# Patient Record
Sex: Female | Born: 2000 | Race: Black or African American | Hispanic: No | Marital: Single | Smoking: Never smoker
Health system: Southern US, Community
[De-identification: ages and names within clinical notes are randomized; demographics above are authoritative.]

## PROBLEM LIST (undated history)

## (undated) DIAGNOSIS — R87629 Unspecified abnormal cytological findings in specimens from vagina: Secondary | ICD-10-CM

## (undated) DIAGNOSIS — J45909 Unspecified asthma, uncomplicated: Secondary | ICD-10-CM

## (undated) HISTORY — DX: Unspecified asthma, uncomplicated: J45.909

## (undated) HISTORY — DX: Unspecified abnormal cytological findings in specimens from vagina: R87.629

## (undated) HISTORY — PX: COLPOSCOPY: SHX161

---
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
## 2019-08-08 ENCOUNTER — Encounter: Payer: Self-pay | Admitting: Advanced Practice Midwife

## 2019-08-23 ENCOUNTER — Telehealth: Payer: Self-pay | Admitting: Advanced Practice Midwife

## 2019-08-23 NOTE — Telephone Encounter (Signed)

## 2019-08-24 ENCOUNTER — Ambulatory Visit: Payer: BC Managed Care – PPO | Admitting: Advanced Practice Midwife

## 2019-08-24 ENCOUNTER — Encounter: Payer: Self-pay | Admitting: Advanced Practice Midwife

## 2019-08-24 ENCOUNTER — Other Ambulatory Visit: Payer: Self-pay

## 2019-08-24 VITALS — BP 112/77 | HR 80 | Ht 62.0 in

## 2019-08-24 DIAGNOSIS — N946 Dysmenorrhea, unspecified: Secondary | ICD-10-CM | POA: Insufficient documentation

## 2019-08-24 MED ORDER — MEDROXYPROGESTERONE ACETATE 150 MG/ML IM SUSP: 150.0000 mg | INTRAMUSCULAR | 0 refills | Status: DC

## 2019-08-24 NOTE — Progress Notes (Signed)
   GYN VISIT Patient name: Carla Ramsey MRN [id number]  Date of birth: [birth year] Chief Complaint:   Dysmenorrhea and Dyspareunia  History of Present Illness:   Carla Ramsey is a 19 y.o. No obstetric history on file. African American female being seen today for dysmenorrhea with regular cycles; q 28 days, lasting 5-7 days. Reports cramping and headaches, also diarrhea. She was on DMPA for 9 consecutive months prior to Covid and she enjoyed occ spotting and no cramping during that time, but since then hasn't used contraception and the dysmenorrhea resumed. Would be open to using again. Reports low abd discomfort, but not vaginal pain, during sex.  Patient's last menstrual period was 07/24/2019. The current method of family planning is condoms- would like something hormonal Last pap <21yo.  Review of Systems:   Pertinent items are noted in HPI Denies fever/chills, dizziness, headaches, visual disturbances, fatigue, shortness of breath, chest pain, abdominal pain, vomiting, abnormal vaginal discharge/itching/odor/irritation, problems with periods, bowel movements, urination, or intercourse unless otherwise stated above.  Pertinent History Reviewed:  Reviewed past medical,surgical, social, obstetrical and family history.  Reviewed problem list, medications and allergies. Physical Assessment:   Vitals:   08/24/19 1519  BP: 112/77  Pulse: 80  Height: 5\' 2"  (1.575 m)  There is no height or weight on file to calculate BMI.       Physical Examination:   General appearance: alert, well appearing, and in no distress  Mental status: alert, oriented to person, place, and time  Skin: warm & dry   Cardiovascular: normal heart rate noted  Respiratory: normal respiratory effort, no distress  Abdomen: soft, non-tender   Pelvic: examination not indicated  Extremities: no edema   Chaperone: n/a    No results found for this or any previous visit (from the past 24 hour(s)).  Assessment &  Plan:  1) Dysmenorrhea> rev'd LARCs and OCPs vs DMPA>pt elects to start using DMPA again; last IC was on 08/21/19 with a condom; will need to have 10 days without sex and with neg UPT can start DMPA  2) Abd pain during sex> rec different positions and less depth of penetration to see if it improves; discussed possibility of discomfort from uterine cramping during sex as well  Meds:  Meds ordered this encounter  Medications  . medroxyPROGESTERone (DEPO-PROVERA) 150 MG/ML injection    Sig: Inject 1 mL (150 mg total) into the muscle every 3 (three) months.    Dispense:  1 mL    Refill:  0    Order Specific Question:   Supervising Provider    Answer:   10/19/19, LUTHER H [[id number]]    No orders of the defined types were placed in this encounter.   Return in about 1 week (around 08/31/2019) for RN visit for depo; then 3 month f/u depo inj and provider visit.  09/02/2019 CNM 08/24/2019 10:26 PM

## 2019-08-24 NOTE — Patient Instructions (Signed)
Medroxyprogesterone injection [Contraceptive] What is this medicine? MEDROXYPROGESTERONE (me DROX ee proe JES te rone) contraceptive injections prevent pregnancy. They provide effective birth control for 3 months. Depo-subQ Provera 104 is also used for treating pain related to endometriosis. This medicine may be used for other purposes; ask your health care provider or pharmacist if you have questions. COMMON BRAND NAME(S): Depo-Provera, Depo-subQ Provera 104 What should I tell my health care provider before I take this medicine? They need to know if you have any of these conditions:  frequently drink alcohol  asthma  blood vessel disease or a history of a blood clot in the lungs or legs  bone disease such as osteoporosis  breast cancer  diabetes  eating disorder (anorexia nervosa or bulimia)  high blood pressure  HIV infection or AIDS  kidney disease  liver disease  mental depression  migraine  seizures (convulsions)  stroke  tobacco smoker  vaginal bleeding  an unusual or allergic reaction to medroxyprogesterone, other hormones, medicines, foods, dyes, or preservatives  pregnant or trying to get pregnant  breast-feeding How should I use this medicine? Depo-Provera Contraceptive injection is given into a muscle. Depo-subQ Provera 104 injection is given under the skin. These injections are given by a health care professional. You must not be pregnant before getting an injection. The injection is usually given during the first 5 days after the start of a menstrual period or 6 weeks after delivery of a baby. Talk to your pediatrician regarding the use of this medicine in children. Special care may be needed. These injections have been used in female children who have started having menstrual periods. Overdosage: If you think you have taken too much of this medicine contact a poison control center or emergency room at once. NOTE: This medicine is only for you. Do not  share this medicine with others. What if I miss a dose? Try not to miss a dose. You must get an injection once every 3 months to maintain birth control. If you cannot keep an appointment, call and reschedule it. If you wait longer than 13 weeks between Depo-Provera contraceptive injections or longer than 14 weeks between Depo-subQ Provera 104 injections, you could get pregnant. Use another method for birth control if you miss your appointment. You may also need a pregnancy test before receiving another injection. What may interact with this medicine? Do not take this medicine with any of the following medications:  bosentan This medicine may also interact with the following medications:  aminoglutethimide  antibiotics or medicines for infections, especially rifampin, rifabutin, rifapentine, and griseofulvin  aprepitant  barbiturate medicines such as phenobarbital or primidone  bexarotene  carbamazepine  medicines for seizures like ethotoin, felbamate, oxcarbazepine, phenytoin, topiramate  modafinil  St. John's wort This list may not describe all possible interactions. Give your health care provider a list of all the medicines, herbs, non-prescription drugs, or dietary supplements you use. Also tell them if you smoke, drink alcohol, or use illegal drugs. Some items may interact with your medicine. What should I watch for while using this medicine? This drug does not protect you against HIV infection (AIDS) or other sexually transmitted diseases. Use of this product may cause you to lose calcium from your bones. Loss of calcium may cause weak bones (osteoporosis). Only use this product for more than 2 years if other forms of birth control are not right for you. The longer you use this product for birth control the more likely you will be at risk   for weak bones. Ask your health care professional how you can keep strong bones. You may have a change in bleeding pattern or irregular periods.  Many females stop having periods while taking this drug. If you have received your injections on time, your chance of being pregnant is very low. If you think you may be pregnant, see your health care professional as soon as possible. Tell your health care professional if you want to get pregnant within the next year. The effect of this medicine may last a long time after you get your last injection. What side effects may I notice from receiving this medicine? Side effects that you should report to your doctor or health care professional as soon as possible:  allergic reactions like skin rash, itching or hives, swelling of the face, lips, or tongue  breast tenderness or discharge  breathing problems  changes in vision  depression  feeling faint or lightheaded, falls  fever  pain in the abdomen, chest, groin, or leg  problems with balance, talking, walking  unusually weak or tired  yellowing of the eyes or skin Side effects that usually do not require medical attention (report to your doctor or health care professional if they continue or are bothersome):  acne  fluid retention and swelling  headache  irregular periods, spotting, or absent periods  temporary pain, itching, or skin reaction at site where injected  weight gain This list may not describe all possible side effects. Call your doctor for medical advice about side effects. You may report side effects to FDA at 1-800-FDA-1088. Where should I keep my medicine? This does not apply. The injection will be given to you by a health care professional. NOTE: This sheet is a summary. It may not cover all possible information. If you have questions about this medicine, talk to your doctor, pharmacist, or health care provider.  2020 Elsevier/Gold Standard (2008-07-21 18:37:56)  

## 2019-08-30 ENCOUNTER — Telehealth: Payer: Self-pay | Admitting: Obstetrics & Gynecology

## 2019-08-30 NOTE — Telephone Encounter (Signed)

## 2019-08-31 ENCOUNTER — Other Ambulatory Visit: Payer: Self-pay

## 2019-08-31 ENCOUNTER — Ambulatory Visit (INDEPENDENT_AMBULATORY_CARE_PROVIDER_SITE_OTHER): Payer: BC Managed Care – PPO

## 2019-08-31 VITALS — Ht 61.0 in | Wt 134.0 lb

## 2019-08-31 DIAGNOSIS — Z3042 Encounter for surveillance of injectable contraceptive: Secondary | ICD-10-CM | POA: Diagnosis not present

## 2019-08-31 DIAGNOSIS — Z3202 Encounter for pregnancy test, result negative: Secondary | ICD-10-CM | POA: Diagnosis not present

## 2019-08-31 LAB — POCT URINE PREGNANCY: Preg Test, Ur: NEGATIVE

## 2019-08-31 MED ORDER — MEDROXYPROGESTERONE ACETATE 150 MG/ML IM SUSP
150.0000 mg | Freq: Once | INTRAMUSCULAR | Status: AC
  Administered 2019-08-31: 10:00:00 150 mg via INTRAMUSCULAR

## 2019-08-31 NOTE — Progress Notes (Signed)
   NURSE VISIT- INJECTION  SUBJECTIVE:  Carla Ramsey is a 19 y.o. [place] female here for a Depo Provera for contraception/period management. She is a GYN patient.   OBJECTIVE:  There were no vitals taken for this visit.  Appears well, in no apparent distress  Injection administered in: Right deltoid    ASSESSMENT: GYN patient Depo Provera for contraception/period management PLAN: Follow-up: in 11-13 weeks for next Depo   Rennis Petty  08/31/2019 10:12 AM

## 2019-08-31 NOTE — Addendum Note (Signed)
Addended by: Federico Flake A on: 08/31/2019 10:31 AM   Modules accepted: Orders

## 2019-11-22 ENCOUNTER — Telehealth: Payer: Self-pay | Admitting: Women's Health

## 2019-11-22 NOTE — Telephone Encounter (Signed)
Called left vm with patient reminding her of appointment and to wear a face mask also if she has covid like symptoms to call office to r/s appointment.

## 2019-11-23 ENCOUNTER — Other Ambulatory Visit: Payer: Self-pay | Admitting: Advanced Practice Midwife

## 2019-11-23 ENCOUNTER — Telehealth: Payer: Self-pay | Admitting: *Deleted

## 2019-11-23 ENCOUNTER — Ambulatory Visit (INDEPENDENT_AMBULATORY_CARE_PROVIDER_SITE_OTHER): Payer: BC Managed Care – PPO | Admitting: *Deleted

## 2019-11-23 ENCOUNTER — Telehealth: Payer: Self-pay | Admitting: Advanced Practice Midwife

## 2019-11-23 ENCOUNTER — Encounter: Payer: Self-pay | Admitting: *Deleted

## 2019-11-23 ENCOUNTER — Other Ambulatory Visit: Payer: Self-pay

## 2019-11-23 ENCOUNTER — Ambulatory Visit: Payer: BC Managed Care – PPO

## 2019-11-23 DIAGNOSIS — Z3042 Encounter for surveillance of injectable contraceptive: Secondary | ICD-10-CM | POA: Diagnosis not present

## 2019-11-23 MED ORDER — MEDROXYPROGESTERONE ACETATE 150 MG/ML IM SUSP: 150.0000 mg | INTRAMUSCULAR | 3 refills | Status: DC

## 2019-11-23 MED ORDER — MEDROXYPROGESTERONE ACETATE 150 MG/ML IM SUSP
150.0000 mg | Freq: Once | INTRAMUSCULAR | Status: AC
  Administered 2019-11-23: 150 mg via INTRAMUSCULAR

## 2019-11-23 NOTE — Progress Notes (Signed)
   NURSE VISIT- INJECTION  SUBJECTIVE:  Carla Ramsey is a 19 y.o. No obstetric history on file. female here for a Depo Provera for contraception/period management. She is a GYN patient.   OBJECTIVE:  There were no vitals taken for this visit.  Appears well, in no apparent distress  Injection administered in: Left deltoid  No orders of the defined types were placed in this encounter.   ASSESSMENT: GYN patient Depo Provera for contraception/period management PLAN: Follow-up: in 11-13 weeks for next Depo   Stoney Bang  11/23/2019 10:00 AM

## 2019-11-23 NOTE — Telephone Encounter (Signed)
Patient has an appointment this morning to get the Depo and she is needing a refill of her medication sent to the [facility] on [address]

## 2019-11-23 NOTE — Telephone Encounter (Signed)
Depo called into [facility] on [address]. Pt needs to schedule a physical. JSY

## 2019-11-25 ENCOUNTER — Ambulatory Visit: Payer: BC Managed Care – PPO

## 2020-02-15 ENCOUNTER — Ambulatory Visit (INDEPENDENT_AMBULATORY_CARE_PROVIDER_SITE_OTHER): Payer: BC Managed Care – PPO | Admitting: *Deleted

## 2020-02-15 DIAGNOSIS — Z308 Encounter for other contraceptive management: Secondary | ICD-10-CM | POA: Diagnosis not present

## 2020-02-15 MED ORDER — MEDROXYPROGESTERONE ACETATE 150 MG/ML IM SUSP
150.0000 mg | Freq: Once | INTRAMUSCULAR | Status: AC
  Administered 2020-02-15: 150 mg via INTRAMUSCULAR

## 2020-02-15 NOTE — Progress Notes (Signed)
   NURSE VISIT- INJECTION  SUBJECTIVE:  Carla Ramsey is a 19 y.o. No obstetric history on file. female here for a Depo Provera for contraception/period management. She is a GYN patient.   OBJECTIVE:  There were no vitals taken for this visit.  Appears well, in no apparent distress  Injection administered in: Right deltoid  Meds ordered this encounter  Medications  . medroxyPROGESTERone (DEPO-PROVERA) injection 150 mg    ASSESSMENT: GYN patient Depo Provera for contraception/period management PLAN: Follow-up: in 11-13 weeks for next Depo   Malachy Mood  02/15/2020 9:42 AM

## 2020-03-12 ENCOUNTER — Encounter: Payer: Self-pay | Admitting: Adult Health

## 2020-03-12 ENCOUNTER — Ambulatory Visit: Payer: BC Managed Care – PPO | Admitting: Adult Health

## 2020-03-12 VITALS — BP 116/77 | HR 82 | Ht 63.0 in | Wt 138.5 lb

## 2020-03-12 DIAGNOSIS — N946 Dysmenorrhea, unspecified: Secondary | ICD-10-CM

## 2020-03-12 DIAGNOSIS — N941 Unspecified dyspareunia: Secondary | ICD-10-CM | POA: Insufficient documentation

## 2020-03-12 DIAGNOSIS — R5383 Other fatigue: Secondary | ICD-10-CM | POA: Diagnosis not present

## 2020-03-12 NOTE — Progress Notes (Signed)
Patient ID: Carla Ramsey, female   DOB: 06/01/2001, 19 y.o.   MRN: [id number] History of Present Illness: Carla Ramsey is a 19 year old black female, single, G0P0, in complaining of painful periods for 4 years, bloating, feels cold at times and tired, and in last year has pain with sex.Started end of 2019 and does not have period now. She has done some research and thinks she has fibroids.  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  Current Medications, Allergies, Past Medical History, Past Surgical History, Family History and Social History were reviewed in [employer] record.     Review of Systems:  Painful periods for about 4 years Is currently taking depo, has stopped period so has helped Feels bloated Pain with sex in last year Feels cold at times and tired   Physical Exam:BP 116/77 (BP Location: Left Arm, Patient Position: Sitting, Cuff Size: Normal)    Pulse 82    Ht 5\' 3"  (1.6 m)    Wt 138 lb 8 oz (62.8 kg)    BMI 24.53 kg/m  General:  Well developed, well nourished, no acute distress Skin:  Warm and dry Neck:  Midline trachea, normal thyroid, good ROM, no lymphadenopathy Lungs; Clear to auscultation bilaterally Cardiovascular: Regular rate and rhythm Pelvic:  External genitalia is normal in appearance, no lesions.  The vagina is normal in appearance. Urethra has no lesions or masses. The cervix is nulliparous, and smooth.  Uterus is felt to be normal size, shape, and contour.  No adnexal masses or tenderness noted.Bladder is non tender, no masses felt. Extremities/musculoskeletal:  No swelling or varicosities noted, no clubbing or cyanosis Psych:  No mood changes, alert and cooperative,seems happy Fall risk is low  Upstream - 03/12/20 0840      Pregnancy Intention Screening   Does the patient want to become pregnant in the next year? No    Does the patient's partner want to become pregnant in the next year? No    Would the patient like to discuss  contraceptive options today? No      Contraception Wrap Up   Current Method Hormonal Injection    End Method Hormonal Injection    Contraception Counseling Provided No         Examination chaperoned by 03/14/20 LPN  Impression and plan:  1. Dysmenorrhea Will get GYN Malachy Mood to assess uterus, did not feel a fibroid, but did discuss may have endometriosis, due to symptoms, but [place] would not show that   Get GYN [place] in about a week and then see me about a week later for pap and physical and ROS   2. Dyspareunia in female   3. Tired Check CBC, CMP and TSH

## 2020-03-13 ENCOUNTER — Telehealth: Payer: Self-pay | Admitting: *Deleted

## 2020-03-13 LAB — COMPREHENSIVE METABOLIC PANEL
ALT: 17 IU/L (ref 0–32)
AST: 15 IU/L (ref 0–40)
Albumin/Globulin Ratio: 1.8 (ref 1.2–2.2)
Albumin: 4.6 g/dL (ref 3.9–5.0)
Alkaline Phosphatase: 82 IU/L (ref 48–121)
BUN/Creatinine Ratio: 10 (ref 9–23)
BUN: 7 mg/dL (ref 6–20)
Bilirubin Total: 0.4 mg/dL (ref 0.0–1.2)
CO2: 21 mmol/L (ref 20–29)
Calcium: 10 mg/dL (ref 8.7–10.2)
Chloride: 105 mmol/L (ref 96–106)
Creatinine, Ser: 0.72 mg/dL (ref 0.57–1.00)
GFR calc Af Amer: 138 mL/min/{1.73_m2} (ref >59)
GFR calc non Af Amer: 120 mL/min/{1.73_m2} (ref >59)
Globulin, Total: 2.6 g/dL (ref 1.5–4.5)
Glucose: 100 mg/dL — ABNORMAL HIGH (ref 65–99)
Potassium: 4.2 mmol/L (ref 3.5–5.2)
Sodium: 139 mmol/L (ref 134–144)
Total Protein: 7.2 g/dL (ref 6.0–8.5)

## 2020-03-13 LAB — CBC
Hematocrit: 42.9 % (ref 34.0–46.6)
Hemoglobin: 14.7 g/dL (ref 11.1–15.9)
MCH: 29.4 pg (ref 26.6–33.0)
MCHC: 34.3 g/dL (ref 31.5–35.7)
MCV: 86 fL (ref 79–97)
Platelets: 222 10*3/uL (ref 150–450)
RBC: 5 x10E6/uL (ref 3.77–5.28)
RDW: 13.3 % (ref 11.7–15.4)
WBC: 4.3 10*3/uL (ref 3.4–10.8)

## 2020-03-13 LAB — TSH: TSH: 1.92 u[IU]/mL (ref 0.450–4.500)

## 2020-03-13 NOTE — Telephone Encounter (Signed)
-----   Message from Adline Potter, NP sent at 03/13/2020  1:34 PM EDT ----- Please let pt know labs were normal... THX

## 2020-03-13 NOTE — Telephone Encounter (Signed)
Pt aware labs were normal. Pt voiced understanding. JSY

## 2020-03-29 ENCOUNTER — Other Ambulatory Visit: Payer: Self-pay

## 2020-03-29 ENCOUNTER — Ambulatory Visit (INDEPENDENT_AMBULATORY_CARE_PROVIDER_SITE_OTHER): Payer: BC Managed Care – PPO

## 2020-03-29 DIAGNOSIS — N946 Dysmenorrhea, unspecified: Secondary | ICD-10-CM

## 2020-03-29 NOTE — Progress Notes (Signed)
PELVIC US TA/TV: homogeneous anteverted uterus,wnl,EEC 6 mm,normal ovaries,ovaries appear mobile,no free fluid,no pain during ultrasound

## 2020-04-12 ENCOUNTER — Telehealth: Payer: Self-pay | Admitting: *Deleted

## 2020-04-12 NOTE — Telephone Encounter (Signed)
Telephoned patient at home number and was busy.

## 2020-04-16 ENCOUNTER — Telehealth: Payer: Self-pay | Admitting: *Deleted

## 2020-04-16 NOTE — Telephone Encounter (Signed)
-----   Message from Adline Potter, NP sent at 04/16/2020  9:10 AM EDT ----- Let pt know [place] was normal

## 2020-04-16 NOTE — Telephone Encounter (Signed)
Pt aware US was normal. Pt voiced understanding. JSY 

## 2020-04-17 ENCOUNTER — Encounter: Payer: Self-pay | Admitting: Adult Health

## 2020-04-17 ENCOUNTER — Ambulatory Visit (INDEPENDENT_AMBULATORY_CARE_PROVIDER_SITE_OTHER): Payer: BC Managed Care – PPO | Admitting: Adult Health

## 2020-04-17 ENCOUNTER — Other Ambulatory Visit (HOSPITAL_COMMUNITY)
Admission: RE | Admit: 2020-04-17 | Discharge: 2020-04-17 | Disposition: A | Discharge status: Home / Self Care | Payer: BC Managed Care – PPO | Source: Ambulatory Visit | Attending: Adult Health | Admitting: Adult Health

## 2020-04-17 VITALS — BP 112/80 | HR 80 | Ht 63.0 in | Wt 138.4 lb

## 2020-04-17 DIAGNOSIS — Z3042 Encounter for surveillance of injectable contraceptive: Secondary | ICD-10-CM

## 2020-04-17 DIAGNOSIS — Z01419 Encounter for gynecological examination (general) (routine) without abnormal findings: Secondary | ICD-10-CM | POA: Insufficient documentation

## 2020-04-17 NOTE — Progress Notes (Signed)
Patient ID: Carla Ramsey, female   DOB: 04/26/2001, 19 y.o.   MRN: [id number] History of Present Illness: Dawanda is a 19 year old black female,single,G0P0, in for a well woman gyn exam and her first pap. She is on depo and does not have a period.    Current Medications, Allergies, Past Medical History, Past Surgical History, Family History and Social History were reviewed in [employer] record.     Review of Systems: Patient denies any headaches, hearing loss, blurred vision, shortness of breath, chest pain, abdominal pain, problems with bowel movements, urination, or intercourse. No joint pain or mood swings. Tired at times.   Physical Exam:BP 112/80 (BP Location: Left Arm, Patient Position: Sitting, Cuff Size: Normal)    Pulse 80    Ht 5\' 3"  (1.6 m)    Wt 138 lb 6.4 oz (62.8 kg)    BMI 24.52 kg/m  General:  Well developed, well nourished, no acute distress Skin:  Warm and dry Neck:  Midline trachea, normal thyroid, good ROM, no lymphadenopathy Lungs; Clear to auscultation bilaterally Breast:  No dominant palpable mass, retraction, or nipple discharge Cardiovascular: Regular rate and rhythm Abdomen:  Soft, non tender, no hepatosplenomegaly Pelvic:  External genitalia is normal in appearance, no lesions.  The vagina is normal in appearance. Urethra has no lesions or masses. The cervix is nulliparous and deviated to the left, pap with GC/CHL and HPV reflex performed.  Uterus is felt to be normal size, shape, and contour.  No adnexal masses or tenderness noted.Bladder is non tender, no masses felt. Extremities/musculoskeletal:  No swelling or varicosities noted, no clubbing or cyanosis Psych:  No mood changes, alert and cooperative,seems happy AA is 1 Fall risk is low PHQ 9 score is 1  Upstream - 04/17/20 0845      Pregnancy Intention Screening   Does the patient want to become pregnant in the next year? No    Does the patient's partner want to become pregnant  in the next year? No    Would the patient like to discuss contraceptive options today? No      Contraception Wrap Up   Current Method Hormonal Injection    End Method Hormonal Injection    Contraception Counseling Provided No         Examination chaperoned by 06/17/20 hall LPN  Impression and Plan: 1. Encounter for gynecological examination with Papanicolaou smear of cervix Pap sent Physical in 1 year Pap in 3 if normal  2. Encounter for surveillance of injectable contraceptive Has refills on depo

## 2020-04-19 LAB — CYTOLOGY - PAP
Chlamydia: POSITIVE — AB
Comment: NEGATIVE
Comment: NORMAL
Diagnosis: NEGATIVE
Diagnosis: REACTIVE
Neisseria Gonorrhea: NEGATIVE

## 2020-04-20 ENCOUNTER — Encounter: Payer: Self-pay | Admitting: Adult Health

## 2020-04-20 ENCOUNTER — Telehealth: Payer: Self-pay | Admitting: Adult Health

## 2020-04-20 DIAGNOSIS — A749 Chlamydial infection, unspecified: Secondary | ICD-10-CM | POA: Insufficient documentation

## 2020-04-20 HISTORY — DX: Chlamydial infection, unspecified: A74.9

## 2020-04-20 MED ORDER — AZITHROMYCIN 500 MG PO TABS: ORAL_TABLET | ORAL | 0 refills | Status: DC

## 2020-04-20 NOTE — Telephone Encounter (Signed)
Pt aware of +chlamydia and that rx sent PCO 11/5/ at 8:30 am and will let me know if I need to treat partner

## 2020-04-20 NOTE — Telephone Encounter (Signed)
Left message to call me about pap, if she calls has +chlamydia, have sent in rx for azithromycin, no sex make appt for POC in 4 weeks, Will send [facility] and partner needs to be treated, can to to PCP or clinic or I can treat, will need name,dob, allergies and drug store

## 2020-05-09 ENCOUNTER — Other Ambulatory Visit: Payer: Self-pay

## 2020-05-09 ENCOUNTER — Ambulatory Visit (INDEPENDENT_AMBULATORY_CARE_PROVIDER_SITE_OTHER): Payer: BC Managed Care – PPO | Admitting: *Deleted

## 2020-05-09 DIAGNOSIS — Z3042 Encounter for surveillance of injectable contraceptive: Secondary | ICD-10-CM

## 2020-05-09 MED ORDER — MEDROXYPROGESTERONE ACETATE 150 MG/ML IM SUSP
150.0000 mg | Freq: Once | INTRAMUSCULAR | Status: AC
  Administered 2020-05-09: 150 mg via INTRAMUSCULAR

## 2020-05-09 NOTE — Progress Notes (Signed)
° °  NURSE VISIT- INJECTION  SUBJECTIVE:  Carla Ramsey is a 19 y.o. G0P0000 female here for a Depo Provera for contraception/period management. She is a GYN patient.   OBJECTIVE:  There were no vitals taken for this visit.  Appears well, in no apparent distress  Injection administered in: Left deltoid  Meds ordered this encounter  Medications   medroxyPROGESTERone (DEPO-PROVERA) injection 150 mg    ASSESSMENT: GYN patient Depo Provera for contraception/period management PLAN: Follow-up: in 11-13 weeks for next Depo   Annamarie Dawley  05/09/2020 9:55 AM

## 2020-05-18 ENCOUNTER — Ambulatory Visit: Payer: BC Managed Care – PPO | Admitting: Adult Health

## 2020-05-29 ENCOUNTER — Other Ambulatory Visit: Payer: Self-pay

## 2020-05-29 ENCOUNTER — Encounter: Payer: Self-pay | Admitting: Adult Health

## 2020-05-29 ENCOUNTER — Ambulatory Visit (INDEPENDENT_AMBULATORY_CARE_PROVIDER_SITE_OTHER): Payer: BC Managed Care – PPO | Admitting: Adult Health

## 2020-05-29 ENCOUNTER — Other Ambulatory Visit (HOSPITAL_COMMUNITY)
Admission: RE | Admit: 2020-05-29 | Discharge: 2020-05-29 | Disposition: A | Discharge status: Home / Self Care | Payer: BC Managed Care – PPO | Source: Ambulatory Visit | Attending: Adult Health | Admitting: Adult Health

## 2020-05-29 VITALS — BP 118/73 | HR 76 | Ht 63.0 in | Wt 137.0 lb

## 2020-05-29 DIAGNOSIS — Z8619 Personal history of other infectious and parasitic diseases: Secondary | ICD-10-CM | POA: Insufficient documentation

## 2020-05-29 NOTE — Progress Notes (Signed)
°  Subjective:     Patient ID: Carla Ramsey, female   DOB: Dec 09, 2000, 19 y.o.   MRN: [id number]  HPI Carla Ramsey is a 19 year old black female,single, G0P0 in for proof of treatment for +chlamydia on pap.  Review of Systems Has pain with penetration at times Reviewed past medical,surgical, social and family history. Reviewed medications and allergies.     Objective:   Physical Exam BP 118/73 (BP Location: Left Arm, Patient Position: Sitting, Cuff Size: Normal)    Pulse 76    Ht 5\' 3"  (1.6 m)    Wt 137 lb (62.1 kg)    BMI 24.27 kg/m  Skin warm and dry.Pelvic: external genitalia is normal in appearance no lesions, vagina: scant white discharge without odor,urethra has no lesions or masses noted, cervix:smooth, uterus: normal size, shape and contour, non tender, no masses felt, adnexa: no masses or tenderness noted. Bladder is non tender and no masses felt. CV swab obtained. Examination chaperoned by LPN   Upstream - 05/29/20 1059      Pregnancy Intention Screening   Does the patient want to become pregnant in the next year? No    Does the patient's partner want to become pregnant in the next year? No    Would the patient like to discuss contraceptive options today? No      Contraception Wrap Up   Current Method Hormonal Injection    End Method Hormonal Injection    Contraception Counseling Provided No             Assessment:     1. History of chlamydia CV swab sent for GC/CHL She declines HIV testing     Plan:     Try astroglide with sex,and increased foreplay Follow up prn

## 2020-05-30 ENCOUNTER — Telehealth: Payer: Self-pay | Admitting: *Deleted

## 2020-05-30 LAB — CERVICOVAGINAL ANCILLARY ONLY
Chlamydia: NEGATIVE
Comment: NEGATIVE
Comment: NORMAL
Neisseria Gonorrhea: NEGATIVE

## 2020-05-30 NOTE — Telephone Encounter (Signed)
Called and left message with patients mom to have her call us back.

## 2020-05-30 NOTE — Telephone Encounter (Signed)
-----   Message from Adline Potter, NP sent at 05/30/2020  2:32 PM EST ----- Let Carla Ramsey know negative GC/CHL, thanks jen

## 2020-07-25 ENCOUNTER — Ambulatory Visit: Payer: BC Managed Care – PPO

## 2020-08-15 ENCOUNTER — Other Ambulatory Visit (INDEPENDENT_AMBULATORY_CARE_PROVIDER_SITE_OTHER): Payer: Self-pay

## 2020-08-15 ENCOUNTER — Other Ambulatory Visit (HOSPITAL_COMMUNITY)
Admission: RE | Admit: 2020-08-15 | Discharge: 2020-08-15 | Disposition: A | Discharge status: Home / Self Care | Payer: BC Managed Care – PPO | Source: Ambulatory Visit | Attending: Obstetrics & Gynecology | Admitting: Obstetrics & Gynecology

## 2020-08-15 ENCOUNTER — Other Ambulatory Visit: Payer: Self-pay

## 2020-08-15 DIAGNOSIS — N898 Other specified noninflammatory disorders of vagina: Secondary | ICD-10-CM | POA: Diagnosis present

## 2020-08-15 NOTE — Progress Notes (Addendum)
   NURSE VISIT- VAGINITIS/STD/POC  SUBJECTIVE:  Carla Ramsey is a 20 y.o. G0P0000 GYN patientfemale here for a vaginal swab for vaginitis screening, STD screen.  She reports the following symptoms: discharge described as white, malodorous and yellow and odor for 5 weeks. Denies abnormal vaginal bleeding, significant pelvic pain, fever, or UTI symptoms.  OBJECTIVE:  There were no vitals taken for this visit.  Appears well, in no apparent distress  ASSESSMENT: Vaginal swab for vaginitis screening  PLAN: Self-collected vaginal probe for Gonorrhea, Chlamydia, Trichomonas, Bacterial Vaginosis, Yeast sent to lab Treatment: to be determined once results are received Follow-up as needed if symptoms persist/worsen, or new symptoms develop  Electa Sterry A Chavie Kolinski  08/15/2020 10:37 AM   Chart reviewed for nurse visit. Agree with plan of care.  Cheral Marker, [place] 08/15/2020 2:30 PM

## 2020-08-16 ENCOUNTER — Other Ambulatory Visit: Payer: Self-pay | Admitting: Women's Health

## 2020-08-16 LAB — CERVICOVAGINAL ANCILLARY ONLY
Bacterial Vaginitis (gardnerella): POSITIVE — AB
Candida Glabrata: NEGATIVE
Candida Vaginitis: POSITIVE — AB
Chlamydia: POSITIVE — AB
Comment: NEGATIVE
Comment: NEGATIVE
Comment: NEGATIVE
Comment: NEGATIVE
Comment: NEGATIVE
Comment: NORMAL
Neisseria Gonorrhea: NEGATIVE
Trichomonas: NEGATIVE

## 2020-08-16 MED ORDER — METRONIDAZOLE 500 MG PO TABS: 500.0000 mg | ORAL_TABLET | Freq: Two times a day (BID) | ORAL | 0 refills | Status: DC

## 2020-08-16 MED ORDER — FLUCONAZOLE 150 MG PO TABS: 150.0000 mg | ORAL_TABLET | Freq: Once | ORAL | 0 refills | Status: AC

## 2020-08-16 MED ORDER — AZITHROMYCIN 500 MG PO TABS: 1000.0000 mg | ORAL_TABLET | Freq: Once | ORAL | 0 refills | Status: AC

## 2020-09-14 ENCOUNTER — Other Ambulatory Visit: Payer: BC Managed Care – PPO

## 2021-01-14 ENCOUNTER — Other Ambulatory Visit: Payer: Self-pay | Admitting: Women's Health

## 2022-05-12 ENCOUNTER — Ambulatory Visit (INDEPENDENT_AMBULATORY_CARE_PROVIDER_SITE_OTHER): Payer: BC Managed Care – PPO | Admitting: *Deleted

## 2022-05-12 VITALS — BP 123/88 | HR 92 | Ht 62.0 in | Wt 150.0 lb

## 2022-05-12 DIAGNOSIS — Z3201 Encounter for pregnancy test, result positive: Secondary | ICD-10-CM | POA: Diagnosis not present

## 2022-05-12 DIAGNOSIS — N926 Irregular menstruation, unspecified: Secondary | ICD-10-CM

## 2022-05-12 DIAGNOSIS — O209 Hemorrhage in early pregnancy, unspecified: Secondary | ICD-10-CM

## 2022-05-12 LAB — POCT URINE PREGNANCY: Preg Test, Ur: POSITIVE — AB

## 2022-05-12 NOTE — Progress Notes (Signed)
   NURSE VISIT- PREGNANCY CONFIRMATION   SUBJECTIVE:  Carla Ramsey is a 21 y.o. G0P0000 female at Unknown by certain LMP of Patient's last menstrual period was 04/01/2022. Here for pregnancy confirmation.  Home pregnancy test: positive x 2   She reports bleeding and mild cramping that started Saturday.  Bleeding is red but denies clots.  She is not taking prenatal vitamins.    OBJECTIVE:  BP 123/88 (BP Location: Left Arm, Patient Position: Sitting, Cuff Size: Normal)   Pulse 92   Ht 5\' 2"  (1.575 m)   Wt 150 lb (68 kg)   LMP 04/01/2022   BMI 27.44 kg/m   Appears well, in no apparent distress  Results for orders placed or performed in visit on 05/12/22 (from the past 24 hour(s))  POCT urine pregnancy   Collection Time: 05/12/22 10:00 AM  Result Value Ref Range   Preg Test, Ur Positive (A) Negative    ASSESSMENT: Positive pregnancy test by LMP     Vaginal bleeding. Will get HCG and ABO/Rh per JAG  PLAN: Schedule for dating ultrasound TBD based on HCG Prenatal vitamins: plans to begin OTC ASAP   Nausea medicines: not currently needed   OB packet given: Yes  Alice Rieger  05/12/2022 10:05 AM

## 2022-05-13 ENCOUNTER — Other Ambulatory Visit: Payer: Self-pay | Admitting: Adult Health

## 2022-05-13 DIAGNOSIS — Z3201 Encounter for pregnancy test, result positive: Secondary | ICD-10-CM

## 2022-05-13 LAB — BETA HCG QUANT (REF LAB): hCG Quant: 546 m[IU]/mL

## 2022-05-13 LAB — ABO/RH: Rh Factor: POSITIVE

## 2022-05-27 ENCOUNTER — Ambulatory Visit: Payer: BC Managed Care – PPO | Admitting: Adult Health

## 2022-05-27 ENCOUNTER — Encounter: Payer: Self-pay | Admitting: Adult Health

## 2022-05-27 VITALS — BP 107/75 | HR 86 | Ht 63.0 in | Wt 148.0 lb

## 2022-05-27 DIAGNOSIS — Z8759 Personal history of other complications of pregnancy, childbirth and the puerperium: Secondary | ICD-10-CM | POA: Diagnosis not present

## 2022-05-27 DIAGNOSIS — Z30013 Encounter for initial prescription of injectable contraceptive: Secondary | ICD-10-CM

## 2022-05-27 DIAGNOSIS — Z113 Encounter for screening for infections with a predominantly sexual mode of transmission: Secondary | ICD-10-CM | POA: Diagnosis not present

## 2022-05-27 DIAGNOSIS — Z3202 Encounter for pregnancy test, result negative: Secondary | ICD-10-CM | POA: Diagnosis not present

## 2022-05-27 LAB — POCT URINE PREGNANCY: Preg Test, Ur: NEGATIVE

## 2022-05-27 MED ORDER — MEDROXYPROGESTERONE ACETATE 150 MG/ML IM SUSP
150.0000 mg | Freq: Once | INTRAMUSCULAR | Status: AC
  Administered 2022-05-27: 150 mg via INTRAMUSCULAR

## 2022-05-27 MED ORDER — MEDROXYPROGESTERONE ACETATE 150 MG/ML IM SUSP: 150.0000 mg | INTRAMUSCULAR | 4 refills | Status: DC

## 2022-05-27 NOTE — Progress Notes (Signed)
  Subjective:     Patient ID: Jacqulyn Cane, female   DOB: [birth year], 21 y.o.   MRN: [id number]  HPI Roshawnda is a 21 year old black female,single, G1P0010, sp recent miscarriage seen at [facility] and last [facility] was 20 on 05/21/22, last sex about 04/27/22. She wants to get back on depo and requests STD testing..  Last pap was negative malignancy 04/17/20.   Review of Systems Sp miscarriage,no complaints  Wants to get on depo  Reviewed past medical,surgical, social and family history. Reviewed medications and allergies.     Objective:   Physical Exam BP 107/75 (BP Location: Left Arm, Patient Position: Sitting, Cuff Size: Normal)   Pulse 86   Ht 5\' 3"  (1.6 m)   Wt 148 lb (67.1 kg)   LMP 04/01/2022   Breastfeeding No   BMI 26.22 kg/m     UPT is negative. Skin warm and dry. Neck: mid line trachea, normal thyroid, good ROM, no lymphadenopathy noted. Lungs: clear to ausculation bilaterally. Cardiovascular: regular rate and rhythm.  Fall risk is low    05/27/2022   11:00 AM 04/17/2020    8:46 AM  Depression screen PHQ 2/9  Decreased Interest 0 0  Down, Depressed, Hopeless 1 0  PHQ - 2 Score 1 0  Altered sleeping  0  Tired, decreased energy  1  Change in appetite  0  Feeling bad or failure about yourself   0  Trouble concentrating  0  Moving slowly or fidgety/restless  0  Suicidal thoughts  0  PHQ-9 Score  1  Difficult doing work/chores  Not difficult at all     Upstream - 05/27/22 1100       Pregnancy Intention Screening   Does the patient want to become pregnant in the next year? No    Does the patient's partner want to become pregnant in the next year? No    Would the patient like to discuss contraceptive options today? Yes      Contraception Wrap Up   Current Method No Contraceptive Precautions    End Method Hormonal Injection    Contraception Counseling Provided Yes    How was the end contraceptive method provided? Prescription              Assessment:     1. Encounter for initial prescription of injectable contraceptive Will give first injection today in office and rx sent in for depo, use condoms for at least 2 weeks and all the time if taking Vitamin C or stop it  Meds ordered this encounter  Medications   medroxyPROGESTERone (DEPO-PROVERA) 150 MG/ML injection    Sig: Inject 1 mL (150 mg total) into the muscle every 3 (three) months.    Dispense:  1 mL    Refill:  4    Order Specific Question:   Supervising Provider    Answer:   05/29/22, LUTHER H [[id number]]      2. History of miscarriage Had miscarriage end of October, seen at [facility]   3. Screening examination for STD (sexually transmitted disease) Urine sent for GC/CHL and trich  4. Negative pregnancy test     Plan:     Return in 12 weeks for depo

## 2022-05-30 LAB — GC/CHLAMYDIA PROBE AMP
Chlamydia trachomatis, NAA: NEGATIVE
Neisseria Gonorrhoeae by PCR: NEGATIVE

## 2022-05-30 LAB — TRICHOMONAS VAGINALIS, PROBE AMP: Trich vag by NAA: NEGATIVE

## 2022-08-13 ENCOUNTER — Ambulatory Visit (INDEPENDENT_AMBULATORY_CARE_PROVIDER_SITE_OTHER): Payer: BC Managed Care – PPO | Admitting: *Deleted

## 2022-08-13 DIAGNOSIS — Z3042 Encounter for surveillance of injectable contraceptive: Secondary | ICD-10-CM

## 2022-08-13 MED ORDER — MEDROXYPROGESTERONE ACETATE 150 MG/ML IM SUSP
150.0000 mg | Freq: Once | INTRAMUSCULAR | Status: AC
  Administered 2022-08-13: 150 mg via INTRAMUSCULAR

## 2022-08-13 NOTE — Progress Notes (Signed)
   NURSE VISIT- INJECTION  SUBJECTIVE:  Carla Ramsey is a 22 y.o. G59P0010 female here for a Depo Provera for contraception/period management. She is a GYN patient.   OBJECTIVE:  There were no vitals taken for this visit.  Appears well, in no apparent distress  Injection administered in: Right deltoid  Meds ordered this encounter  Medications   medroxyPROGESTERone (DEPO-PROVERA) injection 150 mg    ASSESSMENT: GYN patient Depo Provera for contraception/period management PLAN: Follow-up: in 11-13 weeks for next Depo   Levy Pupa  08/13/2022 10:17 AM

## 2022-10-31 ENCOUNTER — Ambulatory Visit: Payer: BC Managed Care – PPO

## 2022-11-03 ENCOUNTER — Ambulatory Visit (INDEPENDENT_AMBULATORY_CARE_PROVIDER_SITE_OTHER): Payer: BC Managed Care – PPO | Admitting: *Deleted

## 2022-11-03 DIAGNOSIS — Z3042 Encounter for surveillance of injectable contraceptive: Secondary | ICD-10-CM

## 2022-11-03 MED ORDER — MEDROXYPROGESTERONE ACETATE 150 MG/ML IM SUSP
150.0000 mg | Freq: Once | INTRAMUSCULAR | Status: AC
  Administered 2022-11-03: 150 mg via INTRAMUSCULAR

## 2022-11-03 NOTE — Progress Notes (Signed)
   NURSE VISIT- INJECTION  SUBJECTIVE:  Carla Ramsey is a 22 y.o. G21P0010 female here for a Depo Provera for contraception/period management. She is a GYN patient.   OBJECTIVE:  There were no vitals taken for this visit.  Appears well, in no apparent distress  Injection administered in: Left deltoid  Meds ordered this encounter  Medications   medroxyPROGESTERone (DEPO-PROVERA) injection 150 mg    ASSESSMENT: GYN patient Depo Provera for contraception/period management PLAN: Follow-up: in 11-13 weeks for next Depo   Jobe Marker  11/03/2022 4:36 PM

## 2023-01-26 ENCOUNTER — Ambulatory Visit: Payer: BC Managed Care – PPO | Admitting: *Deleted

## 2023-01-26 DIAGNOSIS — Z3042 Encounter for surveillance of injectable contraceptive: Secondary | ICD-10-CM

## 2023-01-26 MED ORDER — MEDROXYPROGESTERONE ACETATE 150 MG/ML IM SUSP
150.0000 mg | Freq: Once | INTRAMUSCULAR | Status: AC
  Administered 2023-01-26: 150 mg via INTRAMUSCULAR

## 2023-01-26 NOTE — Progress Notes (Signed)
   NURSE VISIT- INJECTION  SUBJECTIVE:  Carla Ramsey is a 22 y.o. G69P0010 female here for a Depo Provera for contraception/period management. She is a GYN patient.   OBJECTIVE:  There were no vitals taken for this visit.  Appears well, in no apparent distress  Injection administered in: Right deltoid  Meds ordered this encounter  Medications   medroxyPROGESTERone (DEPO-PROVERA) injection 150 mg    ASSESSMENT: GYN patient Depo Provera for contraception/period management PLAN: Follow-up: in 11-13 weeks for next Depo   Jobe Marker  01/26/2023 9:55 AM

## 2023-04-16 ENCOUNTER — Ambulatory Visit: Payer: BC Managed Care – PPO | Admitting: *Deleted

## 2023-04-16 ENCOUNTER — Other Ambulatory Visit (HOSPITAL_COMMUNITY)
Admission: RE | Admit: 2023-04-16 | Discharge: 2023-04-16 | Disposition: A | Discharge status: Home / Self Care | Payer: BC Managed Care – PPO | Source: Ambulatory Visit | Attending: Obstetrics & Gynecology | Admitting: Obstetrics & Gynecology

## 2023-04-16 DIAGNOSIS — Z3042 Encounter for surveillance of injectable contraceptive: Secondary | ICD-10-CM | POA: Diagnosis not present

## 2023-04-16 DIAGNOSIS — N898 Other specified noninflammatory disorders of vagina: Secondary | ICD-10-CM | POA: Insufficient documentation

## 2023-04-16 MED ORDER — MEDROXYPROGESTERONE ACETATE 150 MG/ML IM SUSY
150.0000 mg | PREFILLED_SYRINGE | Freq: Once | INTRAMUSCULAR | Status: AC
  Administered 2023-04-16: 150 mg via INTRAMUSCULAR

## 2023-04-16 NOTE — Progress Notes (Signed)
   NURSE VISIT- VAGINITIS/STD  SUBJECTIVE:  Carla Ramsey is a 22 y.o. G1P0010 GYN patientfemale here for a vaginal swab for vaginitis screening, STD screen.  She reports the following symptoms:  vaginal irritation and vaginal discharge  for 1 week. Denies abnormal vaginal bleeding, significant pelvic pain, fever, or UTI symptoms.  OBJECTIVE:  There were no vitals taken for this visit.  Appears well, in no apparent distress  ASSESSMENT: Vaginal swab for vaginitis screening & STD screening. Pt received Depo in left deltoid.   PLAN: Self-collected vaginal probe for Gonorrhea, Chlamydia, Trichomonas, Bacterial Vaginosis, Yeast sent to lab Treatment: to be determined once results are received Follow-up as needed if symptoms persist/worsen, or new symptoms develop; Depo in 12 weeks  Malachy Mood  04/16/2023 9:37 AM

## 2023-04-20 LAB — CERVICOVAGINAL ANCILLARY ONLY
Bacterial Vaginitis (gardnerella): POSITIVE — AB
Candida Glabrata: NEGATIVE
Candida Vaginitis: NEGATIVE
Chlamydia: NEGATIVE
Comment: NEGATIVE
Comment: NEGATIVE
Comment: NEGATIVE
Comment: NEGATIVE
Comment: NEGATIVE
Comment: NORMAL
Neisseria Gonorrhea: NEGATIVE
Trichomonas: NEGATIVE

## 2023-04-21 ENCOUNTER — Other Ambulatory Visit: Payer: Self-pay | Admitting: Adult Health

## 2023-04-21 MED ORDER — METRONIDAZOLE 500 MG PO TABS: 500.0000 mg | ORAL_TABLET | Freq: Two times a day (BID) | ORAL | 0 refills | Status: DC

## 2023-07-06 ENCOUNTER — Other Ambulatory Visit: Payer: Self-pay | Admitting: Adult Health

## 2023-07-09 ENCOUNTER — Ambulatory Visit: Payer: BC Managed Care – PPO

## 2023-07-10 ENCOUNTER — Other Ambulatory Visit: Payer: Self-pay | Admitting: *Deleted

## 2023-07-10 ENCOUNTER — Ambulatory Visit: Payer: BC Managed Care – PPO

## 2023-07-16 ENCOUNTER — Ambulatory Visit: Payer: BC Managed Care – PPO

## 2023-07-16 DIAGNOSIS — Z3042 Encounter for surveillance of injectable contraceptive: Secondary | ICD-10-CM

## 2023-07-16 MED ORDER — MEDROXYPROGESTERONE ACETATE 150 MG/ML IM SUSY
150.0000 mg | PREFILLED_SYRINGE | Freq: Once | INTRAMUSCULAR | Status: AC
  Administered 2023-07-16: 150 mg via INTRAMUSCULAR

## 2023-07-16 NOTE — Progress Notes (Signed)
   NURSE VISIT- INJECTION  SUBJECTIVE:  Carla Ramsey is a 23 y.o. G65P0010 female here for a Depo Provera  for contraception/period management. She is a GYN patient.   OBJECTIVE:  There were no vitals taken for this visit.  Appears well, in no apparent distress  Injection administered in: Right deltoid  Meds ordered this encounter  Medications   medroxyPROGESTERone  Acetate SUSY 150 mg    ASSESSMENT: GYN patient Depo Provera  for contraception/period management PLAN: Follow-up: in 11-13 weeks for next Depo   Clarita Salt  07/16/2023 9:40 AM

## 2023-08-04 ENCOUNTER — Other Ambulatory Visit (INDEPENDENT_AMBULATORY_CARE_PROVIDER_SITE_OTHER): Payer: 59

## 2023-08-04 ENCOUNTER — Other Ambulatory Visit (HOSPITAL_COMMUNITY)
Admission: RE | Admit: 2023-08-04 | Discharge: 2023-08-04 | Disposition: A | Discharge status: Home / Self Care | Payer: 59 | Source: Ambulatory Visit | Attending: Obstetrics & Gynecology | Admitting: Obstetrics & Gynecology

## 2023-08-04 DIAGNOSIS — N898 Other specified noninflammatory disorders of vagina: Secondary | ICD-10-CM

## 2023-08-04 NOTE — Progress Notes (Signed)
   NURSE VISIT- VAGINITIS  SUBJECTIVE:  Carla Ramsey is a 23 y.o. G1P0010 GYN patientfemale here for a vaginal swab for vaginitis screening.  She reports the following symptoms: discharge described as white and odor for 2 weeks. Denies abnormal vaginal bleeding, significant pelvic pain, fever, or UTI symptoms.  OBJECTIVE:  There were no vitals taken for this visit.  Appears well, in no apparent distress  ASSESSMENT: Vaginal swab for vaginitis screening  PLAN: Self-collected vaginal probe for Gonorrhea, Chlamydia, Trichomonas, Bacterial Vaginosis, Yeast sent to lab Treatment: to be determined once results are received Follow-up as needed if symptoms persist/worsen, or new symptoms develop  Jobe Marker  08/04/2023 10:15 AM

## 2023-08-05 ENCOUNTER — Other Ambulatory Visit: Payer: Self-pay | Admitting: Adult Health

## 2023-08-05 LAB — CERVICOVAGINAL ANCILLARY ONLY
Bacterial Vaginitis (gardnerella): POSITIVE — AB
Candida Glabrata: NEGATIVE
Candida Vaginitis: NEGATIVE
Chlamydia: NEGATIVE
Comment: NEGATIVE
Comment: NEGATIVE
Comment: NEGATIVE
Comment: NEGATIVE
Comment: NEGATIVE
Comment: NORMAL
Neisseria Gonorrhea: NEGATIVE
Trichomonas: NEGATIVE

## 2023-08-05 MED ORDER — METRONIDAZOLE 500 MG PO TABS: 500.0000 mg | ORAL_TABLET | Freq: Two times a day (BID) | ORAL | 0 refills | Status: DC

## 2023-09-01 ENCOUNTER — Ambulatory Visit (INDEPENDENT_AMBULATORY_CARE_PROVIDER_SITE_OTHER): Payer: 59 | Admitting: *Deleted

## 2023-09-01 VITALS — BP 125/80 | HR 89 | Ht 62.0 in | Wt 143.4 lb

## 2023-09-01 DIAGNOSIS — Z3201 Encounter for pregnancy test, result positive: Secondary | ICD-10-CM

## 2023-09-01 DIAGNOSIS — Z789 Other specified health status: Secondary | ICD-10-CM

## 2023-09-01 DIAGNOSIS — N926 Irregular menstruation, unspecified: Secondary | ICD-10-CM

## 2023-09-01 LAB — POCT URINE PREGNANCY: Preg Test, Ur: POSITIVE — AB

## 2023-09-01 NOTE — Progress Notes (Signed)
   NURSE VISIT- PREGNANCY CONFIRMATION   SUBJECTIVE:  Carla Ramsey is a 23 y.o. G30P0010 female by uncertain LMP of No LMP recorded (lmp unknown). Patient is pregnant. Here for pregnancy confirmation.  Has been getting Depo and has not had a period. Home pregnancy test: positive x 2   She reports no complaints.  She is not taking prenatal vitamins.    OBJECTIVE:  BP 125/80 (BP Location: Right Arm, Patient Position: Sitting, Cuff Size: Normal)   Pulse 89   Ht 5\' 2"  (1.575 m)   Wt 143 lb 6.4 oz (65 kg)   LMP  (LMP Unknown)   BMI 26.23 kg/m   Appears well, in no apparent distress  Results for orders placed or performed in visit on 09/01/23 (from the past 24 hours)  POCT urine pregnancy   Collection Time: 09/01/23  9:20 AM  Result Value Ref Range   Preg Test, Ur Positive (A) Negative    ASSESSMENT: Positive pregnancy test, Unknown by LMP    PLAN: Schedule for dating ultrasound TBD based on HCG Prenatal vitamins: plans to begin OTC ASAP   Nausea medicines: not currently needed   OB packet given: Yes  Jobe Marker  09/01/2023 9:29 AM

## 2023-09-02 LAB — BETA HCG QUANT (REF LAB): hCG Quant: 70097 m[IU]/mL

## 2023-09-09 ENCOUNTER — Ambulatory Visit: Payer: 59 | Admitting: Adult Health

## 2023-09-09 ENCOUNTER — Other Ambulatory Visit: Payer: Self-pay | Admitting: Obstetrics & Gynecology

## 2023-09-09 DIAGNOSIS — Z3201 Encounter for pregnancy test, result positive: Secondary | ICD-10-CM

## 2023-09-09 DIAGNOSIS — O3680X0 Pregnancy with inconclusive fetal viability, not applicable or unspecified: Secondary | ICD-10-CM

## 2023-09-14 ENCOUNTER — Ambulatory Visit: Payer: 59 | Admitting: Radiology

## 2023-09-14 DIAGNOSIS — O3680X0 Pregnancy with inconclusive fetal viability, not applicable or unspecified: Secondary | ICD-10-CM

## 2023-09-14 DIAGNOSIS — Z3201 Encounter for pregnancy test, result positive: Secondary | ICD-10-CM

## 2023-09-14 DIAGNOSIS — Z3A10 10 weeks gestation of pregnancy: Secondary | ICD-10-CM | POA: Diagnosis not present

## 2023-09-14 NOTE — Progress Notes (Signed)
 [place]:  unknown LMP (on depo when conceived) Anteverted uterus with viable early IUP,  CRL = 39.15 mm = 10+6 weeks  FHR = 160 bpm No apparent abn seen Anterior pl, gr1, cervix appears long and closed, normal ov's EDD by today's [place] = 04-05-24

## 2023-09-20 ENCOUNTER — Other Ambulatory Visit: Payer: Self-pay

## 2023-09-20 ENCOUNTER — Encounter (HOSPITAL_BASED_OUTPATIENT_CLINIC_OR_DEPARTMENT_OTHER): Payer: Self-pay

## 2023-09-20 ENCOUNTER — Emergency Department (HOSPITAL_BASED_OUTPATIENT_CLINIC_OR_DEPARTMENT_OTHER)
Admission: EM | Admit: 2023-09-20 | Discharge: 2023-09-20 | Disposition: A | Discharge status: Home / Self Care | Attending: Emergency Medicine | Admitting: Emergency Medicine

## 2023-09-20 DIAGNOSIS — Z3A11 11 weeks gestation of pregnancy: Secondary | ICD-10-CM | POA: Diagnosis not present

## 2023-09-20 DIAGNOSIS — O200 Threatened abortion: Secondary | ICD-10-CM | POA: Diagnosis not present

## 2023-09-20 DIAGNOSIS — O209 Hemorrhage in early pregnancy, unspecified: Secondary | ICD-10-CM | POA: Diagnosis present

## 2023-09-20 LAB — URINALYSIS, ROUTINE W REFLEX MICROSCOPIC
Bilirubin Urine: NEGATIVE
Glucose, UA: NEGATIVE mg/dL
Ketones, ur: NEGATIVE mg/dL
Leukocytes,Ua: NEGATIVE
Nitrite: NEGATIVE
Protein, ur: NEGATIVE mg/dL
Specific Gravity, Urine: 1.023 (ref 1.005–1.030)
pH: 6.5 (ref 5.0–8.0)

## 2023-09-20 LAB — WET PREP, GENITAL
Clue Cells Wet Prep HPF POC: NONE SEEN
Sperm: NONE SEEN
Trich, Wet Prep: NONE SEEN
WBC, Wet Prep HPF POC: <10 (ref <10)
Yeast Wet Prep HPF POC: NONE SEEN

## 2023-09-20 LAB — HIV ANTIBODY (ROUTINE TESTING W REFLEX): HIV Screen 4th Generation wRfx: NONREACTIVE

## 2023-09-20 LAB — HCG, QUANTITATIVE, PREGNANCY: hCG, Beta Chain, Quant, S: 85192 m[IU]/mL — ABNORMAL HIGH (ref <5)

## 2023-09-20 NOTE — ED Triage Notes (Signed)
 Patient arrives POV with complaints of light vaginal bleeding (red) with some discharge. Reports no pain.  She is also ~[redacted] weeks pregnant.

## 2023-09-20 NOTE — ED Notes (Signed)
 Patient given discharge instructions. Questions were answered. Patient verbalized understanding of discharge instructions and care at home.

## 2023-09-20 NOTE — ED Provider Notes (Signed)
 [place] EMERGENCY DEPARTMENT AT [facility] Provider Note   CSN: [id number] Arrival date & time: 09/20/23  0820     History  Chief Complaint  Patient presents with   Vaginal Discharge   Vaginal Bleeding    Carla Ramsey is a 23 y.o. female.  Pt is a 23 yo female with no significant pmhx.  Pt is approx [redacted] weeks pregnant.  She noted some blood on the toilet paper when she wiped.  She denies any pain.  She also has some vaginal d/c.  She is blood type B+.       Home Medications Prior to Admission medications   Medication Sig Start Date End Date Taking? Authorizing Provider  Acetaminophen (TYLENOL PO) Take by mouth as needed. Patient not taking: Reported on 09/01/2023    [provider]  Ascorbic Acid (VITAMIN C PO) Take by mouth.    [provider]  Cholecalciferol (VITAMIN D3 PO) Take by mouth.    [provider]  escitalopram (LEXAPRO) 5 MG tablet Take 5 mg by mouth every morning. 07/29/23   [provider]  Ferrous Sulfate (IRON PO) Take by mouth.    [provider]      Allergies    Penicillins    Review of Systems   Review of Systems  Genitourinary:  Positive for vaginal bleeding and vaginal discharge.  All other systems reviewed and are negative.   Physical Exam Updated Vital Signs BP 129/78 (BP Location: Right Arm)   Pulse 94   Temp 98.3 F (36.8 C)   Resp 18   Ht 5\' 2"  (1.575 m)   Wt 65.8 kg   LMP  (LMP Unknown)   SpO2 100%   BMI 26.52 kg/m  Physical Exam Vitals and nursing note reviewed. Exam conducted with a chaperone present.  Constitutional:      Appearance: Normal appearance.  HENT:     Head: Normocephalic and atraumatic.     Right Ear: External ear normal.     Left Ear: External ear normal.     Nose: Nose normal.     Mouth/Throat:     Mouth: Mucous membranes are moist.     Pharynx: Oropharynx is clear.  Eyes:     Extraocular Movements: Extraocular movements intact.      Conjunctiva/sclera: Conjunctivae normal.     Pupils: Pupils are equal, round, and reactive to light.  Cardiovascular:     Rate and Rhythm: Normal rate and regular rhythm.     Pulses: Normal pulses.     Heart sounds: Normal heart sounds.  Pulmonary:     Effort: Pulmonary effort is normal.     Breath sounds: Normal breath sounds.  Abdominal:     General: Abdomen is flat. Bowel sounds are normal.     Palpations: Abdomen is soft.  Genitourinary:    Exam position: Lithotomy position.     Vagina: Normal.     Uterus: Normal.      Adnexa: Right adnexa normal and left adnexa normal.     Comments: Old blood in vagina Musculoskeletal:        General: Normal range of motion.     Cervical back: Normal range of motion and neck supple.  Skin:    General: Skin is warm.     Capillary Refill: Capillary refill takes less than 2 seconds.  Neurological:     General: No focal deficit present.     Mental Status: She is alert and oriented to person, place,  and time.  Psychiatric:        Mood and Affect: Mood normal.        Behavior: Behavior normal.     ED Results / Procedures / Treatments   Labs (all labs ordered are listed, but only abnormal results are displayed) Labs Reviewed  URINALYSIS, ROUTINE W REFLEX MICROSCOPIC - Abnormal; Notable for the following components:      Result Value   Hgb urine dipstick LARGE (*)    Bacteria, UA RARE (*)    All other components within normal limits  WET PREP, GENITAL  HCG, QUANTITATIVE, PREGNANCY  HIV ANTIBODY (ROUTINE TESTING W REFLEX)  GC/CHLAMYDIA PROBE AMP ([place]) NOT AT [facility]    EKG None  Radiology No results found.  Procedures Procedures    Medications Ordered in ED Medications - No data to display  ED Course/ Medical Decision Making/ A&P                                 Medical Decision Making Amount and/or Complexity of Data Reviewed Labs: ordered.   This patient presents to the ED for concern of vaginal bleeding in  pregnancy, this involves an extensive number of treatment options, and is a complaint that carries with it a high risk of complications and morbidity.  The differential diagnosis includes ectopic, threatened ab   Co morbidities that complicate the patient evaluation  pregnancy   Additional history obtained:  Additional history obtained from epic chart review  Lab Tests:  I Ordered, and personally interpreted labs.  The pertinent results include:  ua + hgb (likely vaginal); wet prep neg   Imaging Studies ordered:  I reviewed [place] from 3/3 which showed an IUP at 10 weeks and 6 days (EDD 04/05/24).  I also performed a bedside US which showed an IUP with good FHTs and mvmt.   Medicines ordered and prescription drug management:   I have reviewed the patients home medicines and have made adjustments as needed   Problem List / ED Course:  Threatened miscarriage:  likely a small subchorionic hemorrhage.  Fetus with good mvmt and HB.  She is blood type B+.  She is instructed to have vaginal rest and f/u with obgyn.   Reevaluation:  After the interventions noted above, I reevaluated the patient and found that they have :improved   Social Determinants of Health:  Lives at home   Dispostion:  After consideration of the diagnostic results and the patients response to treatment, I feel that the patent would benefit from discharge with outpatient f/u.          Final Clinical Impression(s) / ED Diagnoses Final diagnoses:  Threatened abortion    Rx / DC Orders ED Discharge Orders     None         Jacalyn Lefevre, MD 09/20/23 [id number]

## 2023-09-20 NOTE — ED Notes (Signed)
 Wet Prep performed by Prev RN

## 2023-09-21 LAB — GC/CHLAMYDIA PROBE AMP (~~LOC~~) NOT AT ARMC
Chlamydia: NEGATIVE
Comment: NEGATIVE
Comment: NORMAL
Neisseria Gonorrhea: NEGATIVE

## 2023-09-23 DIAGNOSIS — Z419 Encounter for procedure for purposes other than remedying health state, unspecified: Secondary | ICD-10-CM | POA: Diagnosis not present

## 2023-09-24 ENCOUNTER — Emergency Department (HOSPITAL_BASED_OUTPATIENT_CLINIC_OR_DEPARTMENT_OTHER)
Admission: EM | Admit: 2023-09-24 | Discharge: 2023-09-24 | Disposition: A | Discharge status: Home / Self Care | Attending: Emergency Medicine | Admitting: Emergency Medicine

## 2023-09-24 ENCOUNTER — Encounter (HOSPITAL_BASED_OUTPATIENT_CLINIC_OR_DEPARTMENT_OTHER): Payer: Self-pay | Admitting: *Deleted

## 2023-09-24 ENCOUNTER — Other Ambulatory Visit: Payer: Self-pay

## 2023-09-24 ENCOUNTER — Emergency Department (HOSPITAL_BASED_OUTPATIENT_CLINIC_OR_DEPARTMENT_OTHER)

## 2023-09-24 DIAGNOSIS — Z3490 Encounter for supervision of normal pregnancy, unspecified, unspecified trimester: Secondary | ICD-10-CM

## 2023-09-24 DIAGNOSIS — Z3A12 12 weeks gestation of pregnancy: Secondary | ICD-10-CM | POA: Diagnosis not present

## 2023-09-24 DIAGNOSIS — O209 Hemorrhage in early pregnancy, unspecified: Secondary | ICD-10-CM | POA: Insufficient documentation

## 2023-09-24 DIAGNOSIS — N939 Abnormal uterine and vaginal bleeding, unspecified: Secondary | ICD-10-CM

## 2023-09-24 LAB — HCG, QUANTITATIVE, PREGNANCY: hCG, Beta Chain, Quant, S: 60039 m[IU]/mL — ABNORMAL HIGH (ref <5)

## 2023-09-24 NOTE — ED Triage Notes (Signed)
 Pt states that she is [redacted] weeks pregnant and she has been having light spotting and was seen here on 3/9 and discharged.  Pt continues to have vaginal spotting, no pain with this.  EDD 04/05/24

## 2023-09-24 NOTE — ED Provider Notes (Signed)
 [place] EMERGENCY DEPARTMENT AT [facility] Provider Note   CSN: [id number] Arrival date & time: 09/24/23  1634     History  Chief Complaint  Patient presents with   Vaginal Bleeding   HPI Carla Ramsey is a 23 y.o. female reporting that she is [redacted] weeks pregnant presenting for vaginal spotting.  She notes some light spotting that started again last night.  Also reports that she was here for the same symptoms on March 9 and there was some concern for threatened abortion at that time.  She denies any abdominal or pelvic pain.  Denies fever.  Denies abnormal vaginal discharge.  Denies urinary symptoms.   Vaginal Bleeding      Home Medications Prior to Admission medications   Medication Sig Start Date End Date Taking? Authorizing Provider  Ascorbic Acid (VITAMIN C PO) Take by mouth.    [provider]  Cholecalciferol (VITAMIN D3 PO) Take by mouth.    [provider]  escitalopram (LEXAPRO) 5 MG tablet Take 5 mg by mouth every morning. 07/29/23   [provider]  Ferrous Sulfate (IRON PO) Take by mouth.    [provider]      Allergies    Penicillins    Review of Systems   Review of Systems  Genitourinary:  Positive for vaginal bleeding.    Physical Exam Updated Vital Signs BP 122/86 (BP Location: Left Arm)   Pulse 76   Temp 98.7 F (37.1 C) (Oral)   Resp 18   LMP  (LMP Unknown)   SpO2 100%  Physical Exam Constitutional:      Appearance: Normal appearance.  HENT:     Head: Normocephalic.     Nose: Nose normal.  Eyes:     Conjunctiva/sclera: Conjunctivae normal.  Pulmonary:     Effort: Pulmonary effort is normal.  Abdominal:     General: There is no distension.     Palpations: Abdomen is soft.     Tenderness: There is no abdominal tenderness.  Neurological:     Mental Status: She is alert.  Psychiatric:        Mood and Affect: Mood normal.     ED Results / Procedures / Treatments   Labs (all labs  ordered are listed, but only abnormal results are displayed) Labs Reviewed  HCG, QUANTITATIVE, PREGNANCY - Abnormal; Notable for the following components:      Result Value   hCG, Beta Chain, Quant, S 60,039 (*)    All other components within normal limits    EKG None  Radiology US OB Comp < 14 Wks Result Date: 09/24/2023 CLINICAL DATA:  Pregnant patient with vaginal bleeding. EXAM: OBSTETRIC <14 WK ULTRASOUND TECHNIQUE: Transabdominal ultrasound was performed for evaluation of the gestation as well as the maternal uterus and adnexal regions. COMPARISON:  09/14/2023 FINDINGS: Intrauterine gestational sac: Single Yolk sac:  Not Visualized, not unexpected for gestational age. Embryo:  Visualized. Cardiac Activity: Visualized. Heart Rate: 121 bpm CRL:   56.5 mm   12 w 1 d                  [place] EDC: 04/06/2024 Subchorionic hemorrhage:  None visualized. Maternal uterus/adnexae: The ovaries are tentatively visualized and normal. No adnexal mass or pelvic free fluid. IMPRESSION: Single live intrauterine pregnancy estimated gestational age [redacted] weeks 1 day based on crown-rump length for ultrasound [facility] 04/06/2024. No subchorionic hemorrhage Electronically Signed   By: Narda Rutherford M.D.   On: 09/24/2023 20:23  Procedures Procedures    Medications Ordered in ED Medications - No data to display  ED Course/ Medical Decision Making/ A&P Clinical Course as of 09/24/23 2031  Thu Sep 24, 2023  2012 Ob/gyn - dr. Lenny Pastel [JR]    Clinical Course User Index [JR] Gareth Eagle, PA-C                                 Medical Decision Making Amount and/or Complexity of Data Reviewed Labs: ordered.   23 yo well appearing female who reports that she is [redacted] weeks pregnant presenting for vaginal spotting.  Exam was unremarkable.  DDx includes threatened miscarriage, subchorionic hemorrhage, symptomatic anemia, other.  Beta-hCG today was 60,000.  Was ~85,000 on March 9.  Discussed patient with Dr. Lenny Pastel of OB/GYN who reviewed her chart and determined that she is likely approximately 11 to [redacted] weeks pregnant and suspects that she does still have a viable intrauterine pregnancy but advised her to follow-up with her OB/GYN provider tomorrow morning as there remains concern for threatened miscarriage given persistence of her vaginal spotting.  Ultrasound here revealed single live intrauterine pregnancy estimated gestational age [redacted] weeks with no evidence of subchorionic hemorrhage.  Discussed return precautions.  Advised to follow-up with her OB/GYN provider.  Discharged in good condition.        Final Clinical Impression(s) / ED Diagnoses Final diagnoses:  Vaginal spotting  Intrauterine pregnancy    Rx / DC Orders ED Discharge Orders     None         Gareth Eagle, PA-C 09/24/23 2032    Tegeler, Canary Brim, MD 09/24/23 2324

## 2023-09-24 NOTE — Discharge Instructions (Signed)
 Evaluation today revealed that you likely have a viable intrauterine pregnancy.  Ultrasound today estimates that the gestational age is 12 weeks and 1 day and there are no other acute findings.  Please follow-up with your OB/GYN provider as there is still concern for threatened miscarriage given your persistence of your vaginal spotting.  If you develop abdominal pain, fever, worsening vaginal bleeding, or any other concerning symptom please return to emergency department further evaluation.

## 2023-09-24 NOTE — Consult Note (Signed)
   OB/GYN Telephone Consult  Carla Ramsey is a 23 y.o. G2P0010 at [redacted]w[redacted]d by 10wk [place] presenting with vaginal bleeding  I was called for a consult regarding the care of this patient by [facility] [place] at [facility] .    The provider had a clinical question regarding bHCG trend and management.   The provider presented the following relevant clinical information and I performed a chart review on the patient and reviewed available documentation: Patient presented with light vaginal bleeding. No si/sx anemia or significant cramping/pain. In ED she has been HDS and exam was unremarkable. hCG [id number] (from [phone]). [place] pending. Provider has question about bHCG trend and [place] read.   BP 122/86 (BP Location: Left Arm)   Pulse 76   Temp 98.7 F (37.1 C) (Oral)   Resp 18   LMP  (LMP Unknown)   SpO2 100%   Exam- performed by consulting provider  Recommendations:  -Declining bHCG is expected/normal at this gestational age -I personally reviewed the images from todays [place] which show a single intrauterine pregnancy with cardiac activity -Reasonable for discharge home. Patient should call office tomorrow AM to schedule her next [facility] appointment  Thank you for this consult and if additional recommendations are needed please call [phone] for the OB/GYN attending on service at [facility].   I spent approximately 5 minutes directly consulting with the provider and verbally discussing this case. Additionally 5 minutes minutes was spent performing chart review and documentation.   Harvie Bridge, MD Attending Obstetrician & Gynecologist, [facility] for [place], [facility]

## 2023-10-07 LAB — OB RESULTS CONSOLE HEPATITIS B SURFACE ANTIGEN: Hepatitis B Surface Ag: NEGATIVE

## 2023-10-08 ENCOUNTER — Ambulatory Visit: Payer: 59

## 2023-10-24 DIAGNOSIS — Z419 Encounter for procedure for purposes other than remedying health state, unspecified: Secondary | ICD-10-CM | POA: Diagnosis not present

## 2023-11-23 DIAGNOSIS — Z419 Encounter for procedure for purposes other than remedying health state, unspecified: Secondary | ICD-10-CM | POA: Diagnosis not present

## 2023-12-24 DIAGNOSIS — Z419 Encounter for procedure for purposes other than remedying health state, unspecified: Secondary | ICD-10-CM | POA: Diagnosis not present

## 2024-01-04 LAB — OB RESULTS CONSOLE RPR: RPR: NONREACTIVE

## 2024-01-23 DIAGNOSIS — Z419 Encounter for procedure for purposes other than remedying health state, unspecified: Secondary | ICD-10-CM | POA: Diagnosis not present

## 2024-02-23 DIAGNOSIS — Z419 Encounter for procedure for purposes other than remedying health state, unspecified: Secondary | ICD-10-CM | POA: Diagnosis not present

## 2024-03-08 ENCOUNTER — Telehealth: Payer: Self-pay | Admitting: Obstetrics & Gynecology

## 2024-03-08 NOTE — Telephone Encounter (Signed)
 Returned patient's call. She is currently receiving care at [facility] but they are not offering water births at this time.  Patient wanting to transfer to us . Appt made for 9/4. Has appt with them on 8/29.Asked for patient to have records faxed over before appt next week. No further questions.

## 2024-03-08 NOTE — Telephone Encounter (Signed)
 Pt is requesting a transfer. Pt is also requesting a water birth. Please advise

## 2024-03-17 ENCOUNTER — Encounter: Payer: Self-pay | Admitting: Women's Health

## 2024-03-17 ENCOUNTER — Ambulatory Visit (INDEPENDENT_AMBULATORY_CARE_PROVIDER_SITE_OTHER): Admitting: Women's Health

## 2024-03-17 VITALS — BP 138/88 | HR 65 | Wt 185.6 lb

## 2024-03-17 DIAGNOSIS — Z3483 Encounter for supervision of other normal pregnancy, third trimester: Secondary | ICD-10-CM | POA: Diagnosis not present

## 2024-03-17 DIAGNOSIS — Z3A37 37 weeks gestation of pregnancy: Secondary | ICD-10-CM | POA: Diagnosis not present

## 2024-03-17 DIAGNOSIS — Z3490 Encounter for supervision of normal pregnancy, unspecified, unspecified trimester: Secondary | ICD-10-CM | POA: Insufficient documentation

## 2024-03-17 DIAGNOSIS — Z3480 Encounter for supervision of other normal pregnancy, unspecified trimester: Secondary | ICD-10-CM

## 2024-03-17 NOTE — Progress Notes (Signed)
 INITIAL OBSTETRICAL VISIT Patient name: Carla Ramsey MRN [id number]  Date of birth: [birth year] Chief Complaint:   Initial Prenatal Visit (Transfer care from [facility]-wants water birth)  History of Present Illness:   Carla Ramsey is a 23 y.o. G53P0010 African-American female at [redacted]w[redacted]d by US  at 10 weeks with an Estimated Date of Delivery: 04/05/24 being seen today for her initial obstetrical visit with us . Transferring from [facility] b/c she wants a Systems developer.    No LMP recorded (lmp unknown). Patient is pregnant. Her obstetrical history is significant for SAB x 1.   Today she reports occasional contractions.  Last pap  (2021 neg in EPIC), unsure if she had one at [facility]     03/17/2024   11:50 AM 05/27/2022   11:00 AM 04/17/2020    8:46 AM  Depression screen PHQ 2/9  Decreased Interest 1 0 0  Down, Depressed, Hopeless 1 1 0  PHQ - 2 Score 2 1 0  Altered sleeping 1  0  Tired, decreased energy 1  1  Change in appetite 0  0  Feeling bad or failure about yourself  0  0  Trouble concentrating 0  0  Moving slowly or fidgety/restless 0  0  Suicidal thoughts 0  0  PHQ-9 Score 4  1  Difficult doing work/chores   Not difficult at all        03/17/2024   11:51 AM 04/17/2020    8:46 AM  GAD 7 : Generalized Anxiety Score  Nervous, Anxious, on Edge 0 1  Control/stop worrying 0 0  Worry too much - different things 1 1  Trouble relaxing 1 0  Restless 1 0  Easily annoyed or irritable 0 0  Afraid - awful might happen 0 0  Total GAD 7 Score 3 2  Anxiety Difficulty  Not difficult at all     Review of Systems:   Pertinent items are noted in HPI Denies cramping/contractions, leakage of fluid, vaginal bleeding, abnormal vaginal discharge w/ itching/odor/irritation, headaches, visual changes, shortness of breath, chest pain, abdominal pain, severe nausea/vomiting, or problems with urination or bowel movements unless otherwise stated above.  Pertinent History Reviewed:  Reviewed past  medical,surgical, social, obstetrical and family history.  Reviewed problem list, medications and allergies. OB History  Gravida Para Term Preterm AB Living  2 0 0 0 1 0  SAB IAB Ectopic Multiple Live Births  1 0 0 0 0    # Outcome Date GA Lbr Len/2nd Weight Sex Type Anes PTL Lv  2 Current           1 SAB 05/2022           Physical Assessment:   Vitals:   03/17/24 1134  BP: 138/88  Pulse: 65  Weight: 185 lb 9.6 oz (84.2 kg)  Body mass index is 33.95 kg/m.       Physical Examination:  General appearance - well appearing, and in no distress  Mental status - alert, oriented to person, place, and time  Psych:  She has a normal mood and affect  Skin - warm and dry, normal color, no suspicious lesions noted  Chest - effort normal, all lung fields clear to auscultation bilaterally  Heart - normal rate and regular rhythm  Abdomen - soft, nontender  Extremities:  No swelling or varicosities noted  Thin prep pap is not done   Chaperone: N/A  TODAY'S FH: 37 FHR via doppler: 147  No results found for this or  any previous visit (from the past 24 hours).  Assessment & Plan:  1) Low-Risk Pregnancy G2P0010 at [redacted]w[redacted]d with an Estimated Date of Delivery: 04/05/24   2) Initial OB visit w/ us > tx from [facility]  3) Plans waterbirth> went to class and is sending certificate, understands providers at hospital during admission will determine eligibility   4) Borderline bp> check daily if >140/90 or pre-e s/s, let us  know/seek care  Meds: No orders of the defined types were placed in this encounter.   Continue prenatal vitamins Reviewed labor s/s, reasons to seek care Genetic & carrier screening: neg per pt Ultrasound: anatomy normal per pt [facility] completed> form faxed if has or is planning to apply for medicaid The nature of [facility] [employer] with multiple MDs and other Advanced Practice Providers was explained to patient; also emphasized that fellows, residents, and  students are part of our team. Does have home bp cuff. Office bp cuff given: no. Rx sent: n/a.  Records requested from [facility]  Follow-up: Return for LROB, weekly, CNM, in person.   No orders of the defined types were placed in this encounter.   Carla Ramsey CNM, [facility] 03/17/2024 12:59 PM

## 2024-03-17 NOTE — Patient Instructions (Signed)
 Carla Ramsey, thank you for choosing our office today! We appreciate the opportunity to meet your healthcare needs. You may receive a short survey by mail, e-mail, or through [employer]. If you are happy with your care we would appreciate if you could take just a few minutes to complete the survey questions. We read all of your comments and take your feedback very seriously. Thank you again for choosing our office.  [facility] [employer] Team at [facility]  [facility] at [facility] ([address] [place], [place] [address]) Entrance C, located off of [address] Free 24/7 valet parking   CLASSES: Go to [email or web address] to register for classes (childbirth, breastfeeding, waterbirth, infant CPR, daddy bootcamp, etc.)  Call the office [phone]) or go to [facility] if: You begin to have strong, frequent contractions Your water breaks.  Sometimes it is a big gush of fluid, sometimes it is just a trickle that keeps getting your panties wet or running down your legs You have vaginal bleeding.  It is normal to have a small amount of spotting if your cervix was checked.  You don't feel your baby moving like normal.  If you don't, get you something to eat and drink and lay down and focus on feeling your baby move.   If your baby is still not moving like normal, you should call the office or go to [facility].  Call the office [phone]) or go to [facility] hospital for these signs of pre-eclampsia: Severe headache that does not go away with Tylenol Visual changes- seeing spots, double, blurred vision Pain under your right breast or upper abdomen that does not go away with Tums or heartburn medicine Nausea and/or vomiting Severe swelling in your hands, feet, and face   [facility] Pediatricians/Family Doctors [facility]): [address] [place], [phone]           [facility]: [address], [phone]                 [facility]): [address], [phone] (call to ask if accepting patients) [facility] Department: [address] [place], [phone]    [facility] Pediatricians/Family Doctors [facility]): [address], [phone] [facility]: [address] [place], [phone] [facility]: [address], [phone]  [facility] Doctors  [facility]): [phone] [facility]: [address], [phone]   [facility] Doctors [facility]: [address], [phone]  [facility] Doctors  [facility]: [phone], [phone]  Home Blood Pressure Monitoring for Patients   Your provider has recommended that you check your blood pressure (BP) at least once a week at home. If you do not have a blood pressure cuff at home, one will be provided for you. Contact your provider if you have not received your monitor within 1 week.   Helpful Tips for Accurate Home Blood Pressure Checks  Don't smoke, exercise, or drink caffeine 30 minutes before checking your BP Use the restroom before checking your BP (a full bladder can raise your pressure) Relax in a comfortable upright chair Feet on the ground Left arm resting comfortably on a flat surface at the level of your heart Legs uncrossed Back supported Sit quietly and don't talk Place the cuff on your bare arm Adjust snuggly, so that only two fingertips  can fit between your skin and the top of the cuff Check 2 readings separated by at least one minute Keep a log of your BP readings For a visual, please reference this diagram: http://ccnc.care/bpdiagram  Provider Name: [facility]     Phone: [phone]  Zone 1: ALL CLEAR  Continue to monitor your symptoms:  BP reading is less than 140 (top number) or less than 90 (bottom number)  No right  upper stomach pain No headaches or seeing spots No feeling nauseated or throwing up No swelling in face and hands  Zone 2: CAUTION Call your doctor's office for any of the following:  BP reading is greater than 140 (top number) or greater than 90 (bottom number)  Stomach pain under your ribs in the middle or right side Headaches or seeing spots Feeling nauseated or throwing up Swelling in face and hands  Zone 3: EMERGENCY  Seek immediate medical care if you have any of the following:  BP reading is greater than160 (top number) or greater than 110 (bottom number) Severe headaches not improving with Tylenol Serious difficulty catching your breath Any worsening symptoms from Zone 2   Braxton Hicks Contractions Contractions of the uterus can occur throughout pregnancy, but they are not always a sign that you are in labor. You may have practice contractions called Braxton Hicks contractions. These false labor contractions are sometimes confused with true labor. What are Darol Irving contractions? Braxton Hicks contractions are tightening movements that occur in the muscles of the uterus before labor. Unlike true labor contractions, these contractions do not result in opening (dilation) and thinning of the cervix. Toward the end of pregnancy (32-34 weeks), Braxton Hicks contractions can happen more often and may become stronger. These contractions are sometimes difficult to tell apart from true labor because they can be very uncomfortable. You should not feel embarrassed if you go to the hospital with false labor. Sometimes, the only way to tell if you are in true labor is for your health care provider to look for changes in the cervix. The health care provider will do a physical exam and may monitor your contractions. If you are not in true labor, the exam should show that your cervix is not dilating and your water has not broken. If there are no other health problems associated with your  pregnancy, it is completely safe for you to be sent home with false labor. You may continue to have Braxton Hicks contractions until you go into true labor. How to tell the difference between true labor and false labor True labor Contractions last 30-70 seconds. Contractions become very regular. Discomfort is usually felt in the top of the uterus, and it spreads to the lower abdomen and low back. Contractions do not go away with walking. Contractions usually become more intense and increase in frequency. The cervix dilates and gets thinner. False labor Contractions are usually shorter and not as strong as true labor contractions. Contractions are usually irregular. Contractions are often felt in the front of the lower abdomen and in the groin. Contractions may go away when you walk around or change positions while lying down. Contractions get weaker and are shorter-lasting as time goes on. The cervix usually does not dilate or become thin. Follow these instructions at home:  Take over-the-counter and prescription medicines only as told by your health care provider. Keep up with your usual exercises and follow other instructions from your health care provider. Eat and drink lightly if you think  you are going into labor. If Braxton Hicks contractions are making you uncomfortable: Change your position from lying down or resting to walking, or change from walking to resting. Sit and rest in a tub of warm water. Drink enough fluid to keep your urine pale yellow. Dehydration may cause these contractions. Do slow and deep breathing several times an hour. Keep all follow-up prenatal visits as told by your health care provider. This is important. Contact a health care provider if: You have a fever. You have continuous pain in your abdomen. Get help right away if: Your contractions become stronger, more regular, and closer together. You have fluid leaking or gushing from your vagina. You pass  blood-tinged mucus (bloody show). You have bleeding from your vagina. You have low back pain that you never had before. You feel your baby's head pushing down and causing pelvic pressure. Your baby is not moving inside you as much as it used to. Summary Contractions that occur before labor are called Braxton Hicks contractions, false labor, or practice contractions. Braxton Hicks contractions are usually shorter, weaker, farther apart, and less regular than true labor contractions. True labor contractions usually become progressively stronger and regular, and they become more frequent. Manage discomfort from [facility] contractions by changing position, resting in a warm bath, drinking plenty of water, or practicing deep breathing. This information is not intended to replace advice given to you by your health care provider. Make sure you discuss any questions you have with your health care provider. Document Revised: 06/12/2017 Document Reviewed: 11/13/2016 Elsevier Patient Education  2020 [employer].    Considering [place]? Guide for patients at [facility] [employer] [facility]) Why consider waterbirth? Gentle birth for babies  Less pain medicine used in labor  May allow for passive descent/less pushing  May reduce perineal tears  More mobility and instinctive maternal position changes  Increased maternal relaxation   Is waterbirth safe? What are the risks of infection, drowning or other complications? Infection:  Very low risk (3.7 % for tub vs 4.8% for bed)  7 in 8000 waterbirths with documented infection  Poorly cleaned equipment most common cause  Slightly lower group B strep transmission rate  Drowning  Maternal:  Very low risk  Related to seizures or fainting  Newborn:  Very low risk. No evidence of increased risk of respiratory problems in multiple large studies  Physiological protection from breathing under water  Avoid underwater birth if there are any fetal  complications  Once baby's head is out of the water, keep it out.  Birth complication  Some reports of cord trauma, but risk decreased by bringing baby to surface gradually  No evidence of increased risk of shoulder dystocia. Mothers can usually change positions faster in water than in a bed, possibly aiding the maneuvers to free the shoulder.   There are 2 things you MUST do to have a waterbirth with [facility]: Attend a waterbirth class at [employer] [facility] at [facility]   3rd Wednesday of every month from 7-9 pm (virtual during COVID) [employer] at [email or web address] or [email or web address] or by calling [phone] Bring us  the certificate from the class to your prenatal appointment or send via MyChart Meet with a midwife at 36 weeks* to see if you can still plan a waterbirth and to sign the consent.   *We also recommend that you schedule as many of your prenatal visits with a midwife as possible.    Helpful information: You may want to  bring a bathing suit top to the hospital to wear during labor but this is optional.  All other supplies are provided by the hospital. Please arrive at the hospital with signs of active labor, and do not wait at home until late in labor. It takes 45 min- 1 hour for fetal monitoring, and check in to your room to take place, plus transport and filling of the waterbirth tub.    Things that would prevent you from having a waterbirth: Premature, <37wks  Previous cesarean birth  Presence of thick meconium-stained fluid  Multiple gestation (Twins, triplets, etc.)  Uncontrolled diabetes or gestational diabetes requiring medication  Hypertension diagnosed in pregnancy or preexisting hypertension (gestational hypertension, preeclampsia, or chronic hypertension) Fetal growth restriction (your baby measures less than 10th percentile on ultrasound) Heavy vaginal bleeding  Non-reassuring fetal heart rate  Active infection (MRSA, etc.).  Group B Strep is NOT a contraindication for waterbirth.  If your labor has to be induced and induction method requires continuous monitoring of the baby's heart rate  Other risks/issues identified by your obstetrical provider   Please remember that birth is unpredictable. Under certain unforeseeable circumstances your provider may advise against giving birth in the tub. These decisions will be made on a case-by-case basis and with the safety of you and your baby as our highest priority.    Updated 10/16/21

## 2024-03-21 ENCOUNTER — Encounter (HOSPITAL_COMMUNITY): Payer: Self-pay | Admitting: Obstetrics & Gynecology

## 2024-03-21 ENCOUNTER — Inpatient Hospital Stay (HOSPITAL_COMMUNITY)
Admission: AD | Admit: 2024-03-21 | Discharge: 2024-03-26 | DRG: 787 | Disposition: A | Discharge status: Home / Self Care | Attending: Obstetrics and Gynecology | Admitting: Obstetrics and Gynecology

## 2024-03-21 ENCOUNTER — Ambulatory Visit: Admitting: *Deleted

## 2024-03-21 ENCOUNTER — Other Ambulatory Visit: Payer: Self-pay

## 2024-03-21 VITALS — BP 165/115 | HR 66

## 2024-03-21 DIAGNOSIS — Z3A37 37 weeks gestation of pregnancy: Secondary | ICD-10-CM

## 2024-03-21 DIAGNOSIS — O26893 Other specified pregnancy related conditions, third trimester: Secondary | ICD-10-CM | POA: Diagnosis not present

## 2024-03-21 DIAGNOSIS — Z3483 Encounter for supervision of other normal pregnancy, third trimester: Secondary | ICD-10-CM

## 2024-03-21 DIAGNOSIS — O1414 Severe pre-eclampsia complicating childbirth: Secondary | ICD-10-CM | POA: Diagnosis not present

## 2024-03-21 DIAGNOSIS — N179 Acute kidney failure, unspecified: Secondary | ICD-10-CM | POA: Diagnosis not present

## 2024-03-21 DIAGNOSIS — O1410 Severe pre-eclampsia, unspecified trimester: Secondary | ICD-10-CM | POA: Diagnosis present

## 2024-03-21 DIAGNOSIS — R030 Elevated blood-pressure reading, without diagnosis of hypertension: Secondary | ICD-10-CM

## 2024-03-21 DIAGNOSIS — Z833 Family history of diabetes mellitus: Secondary | ICD-10-CM

## 2024-03-21 DIAGNOSIS — Z3490 Encounter for supervision of normal pregnancy, unspecified, unspecified trimester: Principal | ICD-10-CM | POA: Diagnosis present

## 2024-03-21 DIAGNOSIS — Z3480 Encounter for supervision of other normal pregnancy, unspecified trimester: Secondary | ICD-10-CM

## 2024-03-21 DIAGNOSIS — O26833 Pregnancy related renal disease, third trimester: Secondary | ICD-10-CM | POA: Diagnosis present

## 2024-03-21 DIAGNOSIS — Z8249 Family history of ischemic heart disease and other diseases of the circulatory system: Secondary | ICD-10-CM

## 2024-03-21 DIAGNOSIS — Z880 Allergy status to penicillin: Secondary | ICD-10-CM

## 2024-03-21 DIAGNOSIS — Z0130 Encounter for examination of blood pressure without abnormal findings: Secondary | ICD-10-CM

## 2024-03-21 DIAGNOSIS — Z98891 History of uterine scar from previous surgery: Principal | ICD-10-CM

## 2024-03-21 LAB — COMPREHENSIVE METABOLIC PANEL WITH GFR
ALT: 16 U/L (ref 0–44)
AST: 27 U/L (ref 15–41)
Albumin: 2.8 g/dL — ABNORMAL LOW (ref 3.5–5.0)
Alkaline Phosphatase: 163 U/L — ABNORMAL HIGH (ref 38–126)
Anion gap: 15 (ref 5–15)
BUN: <5 mg/dL — ABNORMAL LOW (ref 6–20)
CO2: 18 mmol/L — ABNORMAL LOW (ref 22–32)
Calcium: 8.6 mg/dL — ABNORMAL LOW (ref 8.9–10.3)
Chloride: 105 mmol/L (ref 98–111)
Creatinine, Ser: 0.67 mg/dL (ref 0.44–1.00)
GFR, Estimated: >60 mL/min (ref >60)
Glucose, Bld: 80 mg/dL (ref 70–99)
Potassium: 3.1 mmol/L — ABNORMAL LOW (ref 3.5–5.1)
Sodium: 138 mmol/L (ref 135–145)
Total Bilirubin: 0.5 mg/dL (ref 0.0–1.2)
Total Protein: 6.3 g/dL — ABNORMAL LOW (ref 6.5–8.1)

## 2024-03-21 LAB — URINALYSIS, ROUTINE W REFLEX MICROSCOPIC
Bilirubin Urine: NEGATIVE
Glucose, UA: NEGATIVE mg/dL
Ketones, ur: NEGATIVE mg/dL
Nitrite: NEGATIVE
Protein, ur: 100 mg/dL — AB
Specific Gravity, Urine: 1.004 — ABNORMAL LOW (ref 1.005–1.030)
pH: 6 (ref 5.0–8.0)

## 2024-03-21 LAB — POCT URINALYSIS DIPSTICK OB
Glucose, UA: NEGATIVE
Ketones, UA: NEGATIVE
Leukocytes, UA: NEGATIVE
Nitrite, UA: NEGATIVE

## 2024-03-21 LAB — CBC
HCT: 38.7 % (ref 36.0–46.0)
Hemoglobin: 12.5 g/dL (ref 12.0–15.0)
MCH: 29.1 pg (ref 26.0–34.0)
MCHC: 32.3 g/dL (ref 30.0–36.0)
MCV: 90.2 fL (ref 80.0–100.0)
Platelets: 193 K/uL (ref 150–400)
RBC: 4.29 MIL/uL (ref 3.87–5.11)
RDW: 13.4 % (ref 11.5–15.5)
WBC: 8.9 K/uL (ref 4.0–10.5)
nRBC: 0 % (ref 0.0–0.2)

## 2024-03-21 LAB — PROTEIN / CREATININE RATIO, URINE
Creatinine, Urine: 44 mg/dL
Protein Creatinine Ratio: 2.64 mg/mg{creat} — ABNORMAL HIGH (ref 0.00–0.15)
Total Protein, Urine: 116 mg/dL

## 2024-03-21 MED ORDER — LABETALOL HCL 5 MG/ML IV SOLN
40.0000 mg | INTRAVENOUS | Status: DC | PRN
  Administered 2024-03-21: 40 mg via INTRAVENOUS
  Filled 2024-03-21: qty 8

## 2024-03-21 MED ORDER — LABETALOL HCL 5 MG/ML IV SOLN
20.0000 mg | INTRAVENOUS | Status: DC | PRN
  Administered 2024-03-21: 20 mg via INTRAVENOUS
  Filled 2024-03-21: qty 4

## 2024-03-21 MED ORDER — MAGNESIUM SULFATE 40 GM/1000ML IV SOLN
2.0000 g/h | INTRAVENOUS | Status: AC
  Administered 2024-03-21 – 2024-03-24 (×3): 2 g/h via INTRAVENOUS
  Filled 2024-03-21 (×3): qty 1000

## 2024-03-21 MED ORDER — HYDRALAZINE HCL 20 MG/ML IJ SOLN: 10.0000 mg | INTRAMUSCULAR | Status: DC | PRN

## 2024-03-21 MED ORDER — LACTATED RINGERS IV SOLN
INTRAVENOUS | Status: DC
  Administered 2024-03-21: 10 mL via INTRAVENOUS

## 2024-03-21 MED ORDER — LABETALOL HCL 5 MG/ML IV SOLN: 80.0000 mg | INTRAVENOUS | Status: DC | PRN

## 2024-03-21 MED ORDER — MAGNESIUM SULFATE BOLUS VIA INFUSION
4.0000 g | Freq: Once | INTRAVENOUS | Status: AC
  Administered 2024-03-21: 4 g via INTRAVENOUS
  Filled 2024-03-21: qty 1000

## 2024-03-21 NOTE — Addendum Note (Signed)
 Addended by: ILEAN RUTHERFORD HERO on: 03/21/2024 02:48 PM   Modules accepted: Orders

## 2024-03-21 NOTE — Progress Notes (Addendum)
   NURSE VISIT- BLOOD PRESSURE CHECK  SUBJECTIVE:  Carla Ramsey is a 23 y.o. G45P0010 female here for BP check. She is [redacted]w[redacted]d pregnant. Reports bp this morning was 150/84 and notes increase swelling in her lower extremities as well as facial.   HYPERTENSION ROS:  Pregnant:  Severe headaches that don't go away with tylenol /other medicines: No  Visual changes (seeing spots/double/blurred vision) No  Severe pain under right breast breast or in center of upper chest no Severe nausea/vomiting No  Taking medicines as instructed not applicable   OBJECTIVE:  BP (!) 165/115   Pulse 66   LMP  (LMP Unknown)   Appearance alert, concerned  ASSESSMENT: Carla Ramsey [redacted]w[redacted]d  blood pressure check  PLAN: Discussed with Luke Fetters, CNM, [facility]   Recommendations: To MAU   Follow-up: 1 week bp check   Rutherford Rover  03/21/2024 2:48 PM

## 2024-03-21 NOTE — H&P (Signed)
 OBSTETRIC ADMISSION HISTORY AND PHYSICAL  Carla Ramsey is 23 y.o. G2P0010 with IUP at [redacted]w[redacted]d 04/05/2024, by Ultrasound presenting for bp, sent from office, bp 165/115. She received her prenatal care at [facility]   ROS (+) FM, ctx***, peripheral edema (-) VB, LOF. HA, visual changes, CP, SOB, RUQ pain   Prenatal History/Complications NURSING  PROVIDER  [facility] Dating by 10wk u/s  [facility] Model Traditional Anatomy U/S Normal female per pt 'Carla Ramsey'  Initiated care at  early                 Language  English               LAB RESULTS   Support Person   Genetics Neg per pt      NT/IT (FT only)        Carrier Screen Horizon:  Panorama LR female  Rhogam   A1C/GTT Early HgbA1C:  Third trimester 2 hr GTT: nl   Flu Vaccine        TDaP Vaccine   Blood Type  B+  RSV Vaccine   Antibody  Negative  COVID Vaccine   Rubella  Immune  Feeding Plan breast RPR  Negative  Contraception   HBsAg  Negative  Circumcision yes HIV Non Reactive (03/09 0840)  Pediatrician  Gbso-needs to call office HCVAb   Prenatal Classes discussed      BTL Consent   Pap Declined at [facility] NOB   Diagnosis  Date Value Ref Range Status  04/17/2020     Final    - Negative for Intraepithelial Lesions or Malignancy (NILM)  04/17/2020 - Benign reactive/reparative changes   Final    BTL Pre-payment   GC/CT Initial:   36wks:    VBAC Consent   GBS For PCN allergy, check sensitivities   BRx Optimized? [ ]  yes   [ ]  no      DME Rx [ ]  BP cuff [ ]  Weight Scale Waterbirth  [ ]  Class [ ]  Consent [ ]  CNM visit  PHQ9 & GAD7 [  ] new OB [  ] 28 weeks  [  ] 36 weeks Induction  [ ]  Orders Entered [ ] Foley Y/N   OB History  Gravida Para Term Preterm AB Living  2 0 0 0 1 0  SAB IAB Ectopic Multiple Live Births  1 0 0 0 0    # Outcome Date GA Lbr Len/2nd Weight Sex Type Anes PTL Lv  2 Current           1 SAB 05/2022           Patient Active Problem List   Diagnosis Date Noted   Encounter for supervision of  normal pregnancy, antepartum 03/17/2024   History of miscarriage 05/27/2022   Dysmenorrhea 08/24/2019    Past Medical History: Past Medical History:  Diagnosis Date   Asthma    as a child   Chlamydia 04/20/2020   +CHL on Pap, treated 04/20/20 POC__________   Premature birth     Past Surgical History: History reviewed. No pertinent surgical history.  Social History Social History   Socioeconomic History   Marital status: Single    Spouse name: Not on file   Number of children: 0   Years of education: Not on file   Highest education level: Not on file  Occupational History   Not on file  Tobacco Use   Smoking status: Never   Smokeless tobacco: Never  Vaping  Use   Vaping status: Never Used  Substance and Sexual Activity   Alcohol use: No   Drug use: No   Sexual activity: Yes    Birth control/protection: None  Other Topics Concern   Not on file  Social History Narrative   Not on file   Social Drivers of Health   Financial Resource Strain: Low Risk  (03/17/2024)   Overall Financial Resource Strain (CARDIA)    Difficulty of Paying Living Expenses: Not hard at all  Food Insecurity: No Food Insecurity (03/17/2024)   Hunger Vital Sign    Worried About Running Out of Food in the Last Year: Never true    Ran Out of Food in the Last Year: Never true  Transportation Needs: No Transportation Needs (03/17/2024)   PRAPARE - Administrator, Civil Service (Medical): No    Lack of Transportation (Non-Medical): No  Physical Activity: Insufficiently Active (03/17/2024)   Exercise Vital Sign    Days of Exercise per Week: 5 days    Minutes of Exercise per Session: 20 min  Stress: Stress Concern Present (03/17/2024)   [employer] of Occupational Health - Occupational Stress Questionnaire    Feeling of Stress: To some extent  Social Connections: Moderately Isolated (03/17/2024)   Social Connection and Isolation Panel    Frequency of Communication with Friends and Family:  Twice a week    Frequency of Social Gatherings with Friends and Family: Once a week    Attends Religious Services: 1 to 4 times per year    Active Member of [employer] or Organizations: No    Attends Banker Meetings: Never    Marital Status: Never married    Family History: Family History  Problem Relation Age of Onset   Hypertension Father    Diabetes Maternal Grandmother    Heart attack Maternal Grandfather     Allergies: Allergies  Allergen Reactions   Penicillins Hives    Medications Prior to Admission  Medication Sig Dispense Refill Last Dose/Taking   Ferrous Sulfate (IRON PO) Take by mouth.   Past Month   Prenatal Vit-Fe Fumarate-FA (PRENATAL MULTIVITAMIN) TABS tablet Take 1 tablet by mouth daily at 12 noon.   Past Week   Ascorbic Acid (VITAMIN C PO) Take by mouth.      Cholecalciferol (VITAMIN D3 PO) Take by mouth.      escitalopram (LEXAPRO) 5 MG tablet Take 5 mg by mouth every morning. (Patient not taking: Reported on 03/17/2024)        Review of Systems  All systems reviewed and negative except as stated in HPI  PHYSICAL EXAM Blood pressure (!) 173/101, pulse 73, temperature 98.5 F (36.9 C), temperature source Oral, resp. rate 18, height 5' 3 (1.6 m), weight 86.8 kg, SpO2 100%. General appearance: {general exam:16600} Lungs: respirations nonlabored Heart: regular rate*** Abdomen: gravid Extremities: *** Neuro: ***  Fetal monitoring{findings; monitor fetal heart monitor:31527} Uterine activity{Uterine contractions:31516}    Presentation: {desc; fetal presentation:14558}   Prenatal labs: ABO, Rh:   Antibody:   Rubella:   RPR:    HBsAg:    HIV: Non Reactive (03/09 0840)   No results found for: GBS  Anatomy US : ***   There is no immunization history on file for this patient.  Prenatal Transfer Tool  Maternal Diabetes: No Genetic Screening: Normal Maternal Ultrasounds/Referrals: {Maternal Ultrasounds / Referrals:20211} Fetal  Ultrasounds or other Referrals:  {Fetal Ultrasounds or Other Referrals:20213} Maternal Substance Abuse:  {Maternal Substance Abuse:20223} Significant Maternal Medications:  {  Significant Maternal Meds:20233} Significant Maternal Lab Results: {Significant Maternal Lab Results:20235} Number of Prenatal Visits:{Prenatal Visits:27860} Maternal Vaccinations:{Maternal Immunizations:31012} Other Comments:  {Other Comments:20251}   Results for orders placed or performed in visit on 03/21/24 (from the past 24 hours)  POC Urinalysis Dipstick OB   Collection Time: 03/21/24  2:47 PM  Result Value Ref Range   Color, UA     Clarity, UA     Glucose, UA Negative Negative   Bilirubin, UA     Ketones, UA neg    Spec Grav, UA     Blood, UA 1+    pH, UA     POC,PROTEIN,UA Moderate (2+) Negative, Trace, Small (1+), Moderate (2+), Large (3+), 4+   Urobilinogen, UA     Nitrite, UA neg    Leukocytes, UA Negative Negative   Appearance     Odor      Patient Active Problem List   Diagnosis Date Noted   Encounter for supervision of normal pregnancy, antepartum 03/17/2024   History of miscarriage 05/27/2022   Dysmenorrhea 08/24/2019    ASSESSMENT & PLAN Carla Ramsey is 23 y.o. G2P0010 with IUP at [redacted]w[redacted]d 04/05/2024, by Ultrasound admitted for IOL iso preE SF.  Sono at ***: normal anatomy, *** presentation, anterior placenta, EFW ***g, (***%)  #Labor: *** #Pain: epidural PRN*** #FWB: Cat ***  #preE w/SF Admit labs: ***. No baseline data available. Admit exam: edema including abdomen, 3beats of clonus ***. - Mg gtt; labs q***hr - ***  #GSB [place] - PCR ordered, result pending. No ppx abx indicated. If positive, treat with vanc.   #GAD - continue Zoloft or Lexapro?? ***mg  PMH asthma?***   #GBS status:  {gen pos wzh:684356} #Feeding: {Infant feeding:32067} #Reproductive Life planning: {Contraceptives:21111124} #Circ:  {yes/no/default n/a:21102::not applicable}   Barabara Maier, DO FMOB  Fellow, Faculty practice [facility], [facility] [employer]

## 2024-03-21 NOTE — MAU Note (Signed)
 Carla Ramsey is a 23 y.o. at [redacted]w[redacted]d here in MAU reporting: this morning woke up swollen. Thinks she lost her mucous plug. Went to office for BP check - was told to come to MAU. Denies PIH symptoms, VB, LOF, or pain. +FM   LMP: NA Onset of complaint: this AM  Pain score: 0 Vitals:   03/21/24 2133  BP: (!) 169/95  Pulse: 68  Resp: 18  Temp: 98.5 F (36.9 C)  SpO2: 100%     FHT: 149  Lab orders placed from triage: UA

## 2024-03-22 ENCOUNTER — Inpatient Hospital Stay (HOSPITAL_COMMUNITY): Admitting: Anesthesiology

## 2024-03-22 ENCOUNTER — Encounter (HOSPITAL_COMMUNITY): Payer: Self-pay | Admitting: Obstetrics & Gynecology

## 2024-03-22 DIAGNOSIS — Z3A38 38 weeks gestation of pregnancy: Secondary | ICD-10-CM | POA: Diagnosis not present

## 2024-03-22 DIAGNOSIS — Z833 Family history of diabetes mellitus: Secondary | ICD-10-CM | POA: Diagnosis not present

## 2024-03-22 DIAGNOSIS — N179 Acute kidney failure, unspecified: Secondary | ICD-10-CM | POA: Diagnosis present

## 2024-03-22 DIAGNOSIS — R030 Elevated blood-pressure reading, without diagnosis of hypertension: Secondary | ICD-10-CM | POA: Diagnosis present

## 2024-03-22 DIAGNOSIS — O26833 Pregnancy related renal disease, third trimester: Secondary | ICD-10-CM | POA: Diagnosis present

## 2024-03-22 DIAGNOSIS — Z3A37 37 weeks gestation of pregnancy: Secondary | ICD-10-CM | POA: Diagnosis not present

## 2024-03-22 DIAGNOSIS — Z419 Encounter for procedure for purposes other than remedying health state, unspecified: Secondary | ICD-10-CM | POA: Diagnosis not present

## 2024-03-22 DIAGNOSIS — Z8249 Family history of ischemic heart disease and other diseases of the circulatory system: Secondary | ICD-10-CM | POA: Diagnosis not present

## 2024-03-22 DIAGNOSIS — Z3490 Encounter for supervision of normal pregnancy, unspecified, unspecified trimester: Principal | ICD-10-CM | POA: Diagnosis present

## 2024-03-22 DIAGNOSIS — Z880 Allergy status to penicillin: Secondary | ICD-10-CM | POA: Diagnosis not present

## 2024-03-22 DIAGNOSIS — O1414 Severe pre-eclampsia complicating childbirth: Secondary | ICD-10-CM | POA: Diagnosis present

## 2024-03-22 LAB — COMPREHENSIVE METABOLIC PANEL WITH GFR
ALT: 16 U/L (ref 0–44)
ALT: 18 U/L (ref 0–44)
AST: 25 U/L (ref 15–41)
AST: 25 U/L (ref 15–41)
Albumin: 2.4 g/dL — ABNORMAL LOW (ref 3.5–5.0)
Albumin: 2.5 g/dL — ABNORMAL LOW (ref 3.5–5.0)
Alkaline Phosphatase: 142 U/L — ABNORMAL HIGH (ref 38–126)
Alkaline Phosphatase: 141 U/L — ABNORMAL HIGH (ref 38–126)
Anion gap: 9 (ref 5–15)
Anion gap: 12 (ref 5–15)
BUN: <5 mg/dL — ABNORMAL LOW (ref 6–20)
BUN: <5 mg/dL — ABNORMAL LOW (ref 6–20)
CO2: 20 mmol/L — ABNORMAL LOW (ref 22–32)
CO2: 17 mmol/L — ABNORMAL LOW (ref 22–32)
Calcium: 7.8 mg/dL — ABNORMAL LOW (ref 8.9–10.3)
Calcium: 7.4 mg/dL — ABNORMAL LOW (ref 8.9–10.3)
Chloride: 107 mmol/L (ref 98–111)
Chloride: 104 mmol/L (ref 98–111)
Creatinine, Ser: 0.78 mg/dL (ref 0.44–1.00)
Creatinine, Ser: 0.62 mg/dL (ref 0.44–1.00)
GFR, Estimated: >60 mL/min (ref >60)
GFR, Estimated: >60 mL/min (ref >60)
Glucose, Bld: 97 mg/dL (ref 70–99)
Glucose, Bld: 85 mg/dL (ref 70–99)
Potassium: 3.4 mmol/L — ABNORMAL LOW (ref 3.5–5.1)
Potassium: 3.1 mmol/L — ABNORMAL LOW (ref 3.5–5.1)
Sodium: 136 mmol/L (ref 135–145)
Sodium: 133 mmol/L — ABNORMAL LOW (ref 135–145)
Total Bilirubin: 0.6 mg/dL (ref 0.0–1.2)
Total Bilirubin: 0.8 mg/dL (ref 0.0–1.2)
Total Protein: 5.4 g/dL — ABNORMAL LOW (ref 6.5–8.1)
Total Protein: 5.8 g/dL — ABNORMAL LOW (ref 6.5–8.1)

## 2024-03-22 LAB — CBC WITH DIFFERENTIAL/PLATELET
Abs Immature Granulocytes: 0.04 K/uL (ref 0.00–0.07)
Basophils Absolute: 0 K/uL (ref 0.0–0.1)
Basophils Relative: 0 %
Eosinophils Absolute: 0 K/uL (ref 0.0–0.5)
Eosinophils Relative: 0 %
HCT: 35.4 % — ABNORMAL LOW (ref 36.0–46.0)
Hemoglobin: 11.6 g/dL — ABNORMAL LOW (ref 12.0–15.0)
Immature Granulocytes: 1 %
Lymphocytes Relative: 22 %
Lymphs Abs: 1.6 K/uL (ref 0.7–4.0)
MCH: 29.1 pg (ref 26.0–34.0)
MCHC: 32.8 g/dL (ref 30.0–36.0)
MCV: 88.7 fL (ref 80.0–100.0)
Monocytes Absolute: 0.7 K/uL (ref 0.1–1.0)
Monocytes Relative: 10 %
Neutro Abs: 4.9 K/uL (ref 1.7–7.7)
Neutrophils Relative %: 67 %
Platelets: 153 K/uL (ref 150–400)
RBC: 3.99 MIL/uL (ref 3.87–5.11)
RDW: 13.5 % (ref 11.5–15.5)
WBC: 7.2 K/uL (ref 4.0–10.5)
nRBC: 0 % (ref 0.0–0.2)

## 2024-03-22 LAB — CBC
HCT: 37.7 % (ref 36.0–46.0)
HCT: 36.5 % (ref 36.0–46.0)
Hemoglobin: 12.3 g/dL (ref 12.0–15.0)
Hemoglobin: 12 g/dL (ref 12.0–15.0)
MCH: 28.8 pg (ref 26.0–34.0)
MCH: 29.4 pg (ref 26.0–34.0)
MCHC: 32.6 g/dL (ref 30.0–36.0)
MCHC: 32.9 g/dL (ref 30.0–36.0)
MCV: 88.3 fL (ref 80.0–100.0)
MCV: 89.5 fL (ref 80.0–100.0)
Platelets: 173 K/uL (ref 150–400)
Platelets: 159 K/uL (ref 150–400)
RBC: 4.27 MIL/uL (ref 3.87–5.11)
RBC: 4.08 MIL/uL (ref 3.87–5.11)
RDW: 13.7 % (ref 11.5–15.5)
RDW: 13.7 % (ref 11.5–15.5)
WBC: 7.6 K/uL (ref 4.0–10.5)
WBC: 6.9 K/uL (ref 4.0–10.5)
nRBC: 0 % (ref 0.0–0.2)
nRBC: 0 % (ref 0.0–0.2)

## 2024-03-22 LAB — RPR: RPR Ser Ql: NONREACTIVE

## 2024-03-22 LAB — GROUP B STREP BY PCR: Group B strep by PCR: NEGATIVE

## 2024-03-22 LAB — MAGNESIUM
Magnesium: 4.3 mg/dL — ABNORMAL HIGH (ref 1.7–2.4)
Magnesium: 5.1 mg/dL — ABNORMAL HIGH (ref 1.7–2.4)

## 2024-03-22 LAB — TYPE AND SCREEN
ABO/RH(D): B POS
Antibody Screen: NEGATIVE

## 2024-03-22 MED ORDER — LACTATED RINGERS IV SOLN
500.0000 mL | Freq: Once | INTRAVENOUS | Status: AC
  Administered 2024-03-22: 500 mL via INTRAVENOUS

## 2024-03-22 MED ORDER — MISOPROSTOL 50MCG HALF TABLET
50.0000 ug | ORAL_TABLET | ORAL | Status: DC | PRN
  Administered 2024-03-22: 50 ug via ORAL
  Filled 2024-03-22: qty 1

## 2024-03-22 MED ORDER — EPHEDRINE 5 MG/ML INJ: 10.0000 mg | INTRAVENOUS | Status: DC | PRN

## 2024-03-22 MED ORDER — OXYTOCIN BOLUS FROM INFUSION: 333.0000 mL | Freq: Once | INTRAVENOUS | Status: DC

## 2024-03-22 MED ORDER — LACTATED RINGERS IV SOLN: 500.0000 mL | INTRAVENOUS | Status: DC | PRN

## 2024-03-22 MED ORDER — ONDANSETRON HCL 4 MG/2ML IJ SOLN: 4.0000 mg | Freq: Four times a day (QID) | INTRAMUSCULAR | Status: DC | PRN

## 2024-03-22 MED ORDER — SOD CITRATE-CITRIC ACID 500-334 MG/5ML PO SOLN
30.0000 mL | ORAL | Status: DC | PRN
  Administered 2024-03-23: 30 mL via ORAL
  Filled 2024-03-22: qty 30

## 2024-03-22 MED ORDER — ACETAMINOPHEN 325 MG PO TABS: 650.0000 mg | ORAL_TABLET | ORAL | Status: DC | PRN

## 2024-03-22 MED ORDER — LIDOCAINE HCL (PF) 1 % IJ SOLN: 30.0000 mL | INTRAMUSCULAR | Status: DC | PRN

## 2024-03-22 MED ORDER — FENTANYL-BUPIVACAINE-NACL 0.5-0.125-0.9 MG/250ML-% EP SOLN
12.0000 mL/h | EPIDURAL | Status: DC | PRN
  Administered 2024-03-22: 12 mL/h via EPIDURAL
  Filled 2024-03-22 (×2): qty 250

## 2024-03-22 MED ORDER — HYDROXYZINE HCL 50 MG PO TABS
25.0000 mg | ORAL_TABLET | Freq: Three times a day (TID) | ORAL | Status: DC | PRN
  Administered 2024-03-22 (×2): 25 mg via ORAL
  Filled 2024-03-22 (×2): qty 1

## 2024-03-22 MED ORDER — TERBUTALINE SULFATE 1 MG/ML IJ SOLN: 0.2500 mg | Freq: Once | INTRAMUSCULAR | Status: DC | PRN

## 2024-03-22 MED ORDER — NIFEDIPINE ER OSMOTIC RELEASE 30 MG PO TB24
30.0000 mg | ORAL_TABLET | Freq: Every day | ORAL | Status: DC
Start: 2024-03-22 — End: 2024-03-26
  Administered 2024-03-22 – 2024-03-25 (×4): 30 mg via ORAL
  Filled 2024-03-22 (×5): qty 1

## 2024-03-22 MED ORDER — LACTATED RINGERS IV SOLN
INTRAVENOUS | Status: DC
Start: 2024-03-22 — End: 2024-03-23

## 2024-03-22 MED ORDER — DIPHENHYDRAMINE HCL 50 MG/ML IJ SOLN: 12.5000 mg | INTRAMUSCULAR | Status: DC | PRN

## 2024-03-22 MED ORDER — PHENYLEPHRINE 80 MCG/ML (10ML) SYRINGE FOR IV PUSH (FOR BLOOD PRESSURE SUPPORT)
80.0000 ug | PREFILLED_SYRINGE | INTRAVENOUS | Status: DC | PRN
  Filled 2024-03-22: qty 10

## 2024-03-22 MED ORDER — OXYTOCIN-SODIUM CHLORIDE 30-0.9 UT/500ML-% IV SOLN: 2.5000 [IU]/h | INTRAVENOUS | Status: DC

## 2024-03-22 MED ORDER — EPHEDRINE 5 MG/ML INJ
10.0000 mg | INTRAVENOUS | Status: DC | PRN
  Filled 2024-03-22: qty 5

## 2024-03-22 MED ORDER — OXYTOCIN-SODIUM CHLORIDE 30-0.9 UT/500ML-% IV SOLN
1.0000 m[IU]/min | INTRAVENOUS | Status: DC
  Filled 2024-03-22: qty 500

## 2024-03-22 MED ORDER — MISOPROSTOL 50MCG HALF TABLET: 50.0000 ug | ORAL_TABLET | Freq: Once | ORAL | Status: DC

## 2024-03-22 MED ORDER — PHENYLEPHRINE 80 MCG/ML (10ML) SYRINGE FOR IV PUSH (FOR BLOOD PRESSURE SUPPORT): 80.0000 ug | PREFILLED_SYRINGE | INTRAVENOUS | Status: DC | PRN

## 2024-03-22 MED ORDER — MISOPROSTOL 25 MCG QUARTER TABLET: 25.0000 ug | ORAL_TABLET | Freq: Once | ORAL | Status: DC

## 2024-03-22 MED ORDER — LIDOCAINE HCL (PF) 1 % IJ SOLN
INTRAMUSCULAR | Status: DC | PRN
  Administered 2024-03-22 (×2): 4 mL via EPIDURAL

## 2024-03-22 MED ORDER — OXYTOCIN-SODIUM CHLORIDE 30-0.9 UT/500ML-% IV SOLN
1.0000 m[IU]/min | INTRAVENOUS | Status: DC
  Administered 2024-03-22: 2 m[IU]/min via INTRAVENOUS

## 2024-03-22 NOTE — Progress Notes (Signed)
 23 yo g2p0 at 38 weeks here iol for severe preE (BPs). BP now well controlled, on mag, cat 1 tracing, normal labs this morning. S/p ripening with foley and cytotec . Now on pitocin , moderate contractions with that. Agreeable now to arom, verbal informed consent obtained including discussion of risks such as cord prolapse and fetal intolerance, patient agrees to continue, cervix 4 cm dilated, vertex well applied, arom performed (scant clear fluid), cat 1 tracing afterward, will plan to continue to up-titrate pitocin , iupc if needed.

## 2024-03-22 NOTE — Anesthesia Preprocedure Evaluation (Signed)
 Anesthesia Evaluation  Patient identified by MRN, date of birth, ID band Patient awake    Reviewed: Allergy & Precautions, NPO status , Patient's Chart, lab work & pertinent test results  Airway Mallampati: III  TM Distance: >3 FB Neck ROM: Full    Dental   Pulmonary asthma (childhood)    Pulmonary exam normal breath sounds clear to auscultation       Cardiovascular hypertension (pre-eclampsia with severe features),  Rhythm:Regular Rate:Normal     Neuro/Psych negative neurological ROS     GI/Hepatic negative GI ROS, Neg liver ROS,,,  Endo/Other  negative endocrine ROS    Renal/GU negative Renal ROS     Musculoskeletal   Abdominal   Peds  Hematology negative hematology ROS (+) Lab Results      Component                Value               Date                      WBC                      7.6                 03/22/2024                HGB                      12.3                03/22/2024                HCT                      37.7                03/22/2024                MCV                      88.3                03/22/2024                PLT                      173                 03/22/2024              Anesthesia Other Findings   Reproductive/Obstetrics (+) Pregnancy                              Anesthesia Physical Anesthesia Plan  ASA: 2  Anesthesia Plan: Epidural   Post-op Pain Management:    Induction:   PONV Risk Score and Plan:   Airway Management Planned: Natural Airway  Additional Equipment:   Intra-op Plan:   Post-operative Plan:   Informed Consent: I have reviewed the patients History and Physical, chart, labs and discussed the procedure including the risks, benefits and alternatives for the proposed anesthesia with the patient or authorized representative who has indicated his/her understanding and acceptance.       Plan Discussed with:  Anesthesiologist  Anesthesia Plan Comments: (I have discussed risks  of neuraxial anesthesia including but not limited to infection, bleeding, nerve injury, back pain, headache, seizures, and failure of block. Patient denies bleeding disorders and is not currently anticoagulated. Labs have been reviewed. Risks and benefits discussed. All patient's questions answered.  )        Anesthesia Quick Evaluation

## 2024-03-22 NOTE — Anesthesia Procedure Notes (Signed)
 Epidural Patient location during procedure: OB Start time: 03/22/2024 4:22 PM End time: 03/22/2024 4:27 PM  Staffing Anesthesiologist: Peggye Delon Brunswick, MD Performed: anesthesiologist   Preanesthetic Checklist Completed: patient identified, IV checked, site marked, risks and benefits discussed, surgical consent, monitors and equipment checked, pre-op evaluation and timeout performed  Epidural Patient position: sitting Prep: DuraPrep and site prepped and draped Patient monitoring: continuous pulse ox and blood pressure Approach: midline Location: L3-L4 Injection technique: LOR saline  Needle:  Needle type: Tuohy  Needle gauge: 17 G Needle length: 9 cm and 9 Needle insertion depth: 6.5 cm Catheter type: closed end flexible Catheter size: 19 Gauge Catheter at skin depth: 11 cm Test dose: negative  Assessment Events: blood not aspirated, no cerebrospinal fluid, injection not painful, no injection resistance, no paresthesia and negative IV test  Additional Notes The patient has requested an epidural for labor pain management. Risks and benefits including, but not limited to, infection, bleeding, local anesthetic toxicity, headache, hypotension, back pain, block failure, etc. were discussed with the patient. The patient expressed understanding and consented to the procedure. I confirmed that the patient has no bleeding disorders and is not taking blood thinners. I confirmed the patient's last platelet count with the nurse. A time-out was performed immediately prior to the procedure. Please see nursing documentation for vital signs. Sterile technique was used throughout the whole procedure. Once LOR achieved, the epidural catheter threaded easily without resistance. Aspiration of the catheter was negative for blood and CSF. The epidural was dosed slowly and an infusion was started.  1 attempt(s)Reason for block:procedure for pain

## 2024-03-22 NOTE — Progress Notes (Signed)
 23 yo g2p0 at 38 weeks here iol for severe preE (BPs). BP now well controlled, on mag, cat 1 tracing, normal labs this morning. Is s/p ripening with foley and cytotec . Not contracting painfully. 4 cm dilated on exam. Patient elects to start pitocin , defers arom for now though agreeable to that in the future.

## 2024-03-22 NOTE — Progress Notes (Signed)
 To bedside to discuss RBA of oral cytotec  with patient. Patient agrees with this intervention.  RN recently pulled on FB, remains in situ.  Additionally discussed Atarax  25mg  PO for relief of anxiety and therapeutic rest. Patient desires this medication, ordered.  Camie Rote, MSN, CNM, RNC-OB Certified Nurse Midwife, [facility] 03/22/2024 5:05 AM

## 2024-03-23 ENCOUNTER — Other Ambulatory Visit: Payer: Self-pay

## 2024-03-23 ENCOUNTER — Encounter (HOSPITAL_COMMUNITY): Payer: Self-pay | Admitting: Obstetrics & Gynecology

## 2024-03-23 ENCOUNTER — Encounter (HOSPITAL_COMMUNITY)
Admission: AD | Disposition: A | Discharge status: Home / Self Care | Payer: Self-pay | Source: Home / Self Care | Attending: Obstetrics and Gynecology

## 2024-03-23 DIAGNOSIS — Z3A38 38 weeks gestation of pregnancy: Secondary | ICD-10-CM

## 2024-03-23 DIAGNOSIS — Z98891 History of uterine scar from previous surgery: Secondary | ICD-10-CM

## 2024-03-23 DIAGNOSIS — Z3A37 37 weeks gestation of pregnancy: Secondary | ICD-10-CM

## 2024-03-23 DIAGNOSIS — O1414 Severe pre-eclampsia complicating childbirth: Secondary | ICD-10-CM

## 2024-03-23 LAB — COMPREHENSIVE METABOLIC PANEL WITH GFR
ALT: 17 U/L (ref 0–44)
AST: 31 U/L (ref 15–41)
Albumin: 2.5 g/dL — ABNORMAL LOW (ref 3.5–5.0)
Alkaline Phosphatase: 160 U/L — ABNORMAL HIGH (ref 38–126)
Anion gap: 13 (ref 5–15)
BUN: <5 mg/dL — ABNORMAL LOW (ref 6–20)
CO2: 16 mmol/L — ABNORMAL LOW (ref 22–32)
Calcium: 7.5 mg/dL — ABNORMAL LOW (ref 8.9–10.3)
Chloride: 101 mmol/L (ref 98–111)
Creatinine, Ser: 1.16 mg/dL — ABNORMAL HIGH (ref 0.44–1.00)
GFR, Estimated: >60 mL/min (ref >60)
Glucose, Bld: 102 mg/dL — ABNORMAL HIGH (ref 70–99)
Potassium: 3.5 mmol/L (ref 3.5–5.1)
Sodium: 130 mmol/L — ABNORMAL LOW (ref 135–145)
Total Bilirubin: 1 mg/dL (ref 0.0–1.2)
Total Protein: 5.8 g/dL — ABNORMAL LOW (ref 6.5–8.1)

## 2024-03-23 LAB — CBC
HCT: 35.9 % — ABNORMAL LOW (ref 36.0–46.0)
Hemoglobin: 12.1 g/dL (ref 12.0–15.0)
MCH: 29.4 pg (ref 26.0–34.0)
MCHC: 33.7 g/dL (ref 30.0–36.0)
MCV: 87.3 fL (ref 80.0–100.0)
Platelets: 172 K/uL (ref 150–400)
RBC: 4.11 MIL/uL (ref 3.87–5.11)
RDW: 13.9 % (ref 11.5–15.5)
WBC: 12 K/uL — ABNORMAL HIGH (ref 4.0–10.5)
nRBC: 0.2 % (ref 0.0–0.2)

## 2024-03-23 SURGERY — Surgical Case
Anesthesia: Epidural

## 2024-03-23 MED ORDER — ONDANSETRON HCL 4 MG/2ML IJ SOLN
INTRAMUSCULAR | Status: DC | PRN
Start: 2024-03-23 — End: 2024-03-23
  Administered 2024-03-23: 4 mg via INTRAVENOUS

## 2024-03-23 MED ORDER — NALOXONE HCL 4 MG/10ML IJ SOLN: 1.0000 ug/kg/h | INTRAVENOUS | Status: DC | PRN

## 2024-03-23 MED ORDER — METOCLOPRAMIDE HCL 5 MG/ML IJ SOLN
INTRAMUSCULAR | Status: DC | PRN
  Administered 2024-03-23: 10 mg via INTRAVENOUS

## 2024-03-23 MED ORDER — DIPHENHYDRAMINE HCL 25 MG PO CAPS: 25.0000 mg | ORAL_CAPSULE | Freq: Four times a day (QID) | ORAL | Status: DC | PRN

## 2024-03-23 MED ORDER — SENNOSIDES-DOCUSATE SODIUM 8.6-50 MG PO TABS
2.0000 | ORAL_TABLET | Freq: Every day | ORAL | Status: DC
  Administered 2024-03-24 – 2024-03-26 (×3): 2 via ORAL
  Filled 2024-03-23 (×3): qty 2

## 2024-03-23 MED ORDER — DEXAMETHASONE SODIUM PHOSPHATE 10 MG/ML IJ SOLN
INTRAMUSCULAR | Status: DC | PRN
  Administered 2024-03-23: 10 mg via INTRAVENOUS

## 2024-03-23 MED ORDER — ACETAMINOPHEN 500 MG PO TABS
1000.0000 mg | ORAL_TABLET | Freq: Four times a day (QID) | ORAL | Status: AC
  Administered 2024-03-23 – 2024-03-24 (×3): 1000 mg via ORAL
  Filled 2024-03-23 (×4): qty 2

## 2024-03-23 MED ORDER — KETOROLAC TROMETHAMINE 30 MG/ML IJ SOLN
30.0000 mg | Freq: Four times a day (QID) | INTRAMUSCULAR | Status: AC
  Administered 2024-03-23 – 2024-03-24 (×4): 30 mg via INTRAVENOUS
  Filled 2024-03-23 (×4): qty 1

## 2024-03-23 MED ORDER — FENTANYL CITRATE (PF) 100 MCG/2ML IJ SOLN
INTRAMUSCULAR | Status: DC | PRN
  Administered 2024-03-23: 100 ug via EPIDURAL

## 2024-03-23 MED ORDER — SODIUM BICARBONATE 8.4 % IV SOLN
INTRAVENOUS | Status: DC | PRN
  Administered 2024-03-23 (×2): 5 mL via EPIDURAL

## 2024-03-23 MED ORDER — OXYTOCIN-SODIUM CHLORIDE 30-0.9 UT/500ML-% IV SOLN: 2.5000 [IU]/h | INTRAVENOUS | Status: AC

## 2024-03-23 MED ORDER — CEFAZOLIN SODIUM-DEXTROSE 2-3 GM-%(50ML) IV SOLR
INTRAVENOUS | Status: DC | PRN
  Administered 2024-03-23: 2 g via INTRAVENOUS

## 2024-03-23 MED ORDER — AMISULPRIDE (ANTIEMETIC) 5 MG/2ML IV SOLN: 10.0000 mg | Freq: Once | INTRAVENOUS | Status: DC | PRN

## 2024-03-23 MED ORDER — WITCH HAZEL-GLYCERIN EX PADS: 1.0000 | MEDICATED_PAD | CUTANEOUS | Status: DC | PRN

## 2024-03-23 MED ORDER — LACTATED RINGERS IV SOLN: INTRAVENOUS | Status: AC

## 2024-03-23 MED ORDER — COCONUT OIL OIL: 1.0000 | TOPICAL_OIL | Status: DC | PRN

## 2024-03-23 MED ORDER — TRANEXAMIC ACID-NACL 1000-0.7 MG/100ML-% IV SOLN
INTRAVENOUS | Status: DC | PRN
  Administered 2024-03-23: 1000 mg via INTRAVENOUS

## 2024-03-23 MED ORDER — GABAPENTIN 100 MG PO CAPS
100.0000 mg | ORAL_CAPSULE | Freq: Three times a day (TID) | ORAL | Status: DC
  Administered 2024-03-23 – 2024-03-26 (×10): 100 mg via ORAL
  Filled 2024-03-23 (×10): qty 1

## 2024-03-23 MED ORDER — DIPHENHYDRAMINE HCL 25 MG PO CAPS: 25.0000 mg | ORAL_CAPSULE | ORAL | Status: DC | PRN

## 2024-03-23 MED ORDER — KETOROLAC TROMETHAMINE 30 MG/ML IJ SOLN: 30.0000 mg | Freq: Four times a day (QID) | INTRAMUSCULAR | Status: DC | PRN

## 2024-03-23 MED ORDER — MORPHINE SULFATE (PF) 0.5 MG/ML IJ SOLN
INTRAMUSCULAR | Status: AC
  Filled 2024-03-23: qty 10

## 2024-03-23 MED ORDER — MEASLES, MUMPS & RUBELLA VAC IJ SOLR: 0.5000 mL | Freq: Once | INTRAMUSCULAR | Status: DC

## 2024-03-23 MED ORDER — ZOLPIDEM TARTRATE 5 MG PO TABS: 5.0000 mg | ORAL_TABLET | Freq: Every evening | ORAL | Status: DC | PRN

## 2024-03-23 MED ORDER — OXYTOCIN-SODIUM CHLORIDE 30-0.9 UT/500ML-% IV SOLN
INTRAVENOUS | Status: DC | PRN
  Administered 2024-03-23: 999 mL/h via INTRAVENOUS

## 2024-03-23 MED ORDER — POTASSIUM CHLORIDE CRYS ER 20 MEQ PO TBCR
20.0000 meq | EXTENDED_RELEASE_TABLET | Freq: Every day | ORAL | Status: DC
  Administered 2024-03-23 – 2024-03-26 (×4): 20 meq via ORAL
  Filled 2024-03-23 (×4): qty 1

## 2024-03-23 MED ORDER — NALOXONE HCL 0.4 MG/ML IJ SOLN: 0.4000 mg | INTRAMUSCULAR | Status: DC | PRN

## 2024-03-23 MED ORDER — SCOPOLAMINE 1 MG/3DAYS TD PT72
1.0000 | MEDICATED_PATCH | Freq: Once | TRANSDERMAL | Status: AC
Start: 2024-03-23 — End: 2024-03-26
  Administered 2024-03-23: 1 mg via TRANSDERMAL

## 2024-03-23 MED ORDER — IBUPROFEN 600 MG PO TABS
600.0000 mg | ORAL_TABLET | Freq: Four times a day (QID) | ORAL | Status: DC
  Administered 2024-03-24 – 2024-03-26 (×8): 600 mg via ORAL
  Filled 2024-03-23 (×9): qty 1

## 2024-03-23 MED ORDER — ENOXAPARIN SODIUM 40 MG/0.4ML IJ SOSY
40.0000 mg | PREFILLED_SYRINGE | INTRAMUSCULAR | Status: DC
  Administered 2024-03-24 – 2024-03-26 (×3): 40 mg via SUBCUTANEOUS
  Filled 2024-03-23 (×3): qty 0.4

## 2024-03-23 MED ORDER — ONDANSETRON HCL 4 MG/2ML IJ SOLN: 4.0000 mg | Freq: Once | INTRAMUSCULAR | Status: DC | PRN

## 2024-03-23 MED ORDER — FENTANYL CITRATE (PF) 100 MCG/2ML IJ SOLN: 25.0000 ug | INTRAMUSCULAR | Status: DC | PRN

## 2024-03-23 MED ORDER — SODIUM CHLORIDE 0.9 % IV SOLN
INTRAVENOUS | Status: DC | PRN
  Administered 2024-03-23: 500 mg via INTRAVENOUS

## 2024-03-23 MED ORDER — SCOPOLAMINE 1 MG/3DAYS TD PT72
MEDICATED_PATCH | TRANSDERMAL | Status: AC
  Filled 2024-03-23: qty 1

## 2024-03-23 MED ORDER — SODIUM CHLORIDE 0.9% FLUSH: 3.0000 mL | INTRAVENOUS | Status: DC | PRN

## 2024-03-23 MED ORDER — SIMETHICONE 80 MG PO CHEW
80.0000 mg | CHEWABLE_TABLET | Freq: Three times a day (TID) | ORAL | Status: DC
  Administered 2024-03-23 – 2024-03-26 (×8): 80 mg via ORAL
  Filled 2024-03-23 (×9): qty 1

## 2024-03-23 MED ORDER — MEPERIDINE HCL 25 MG/ML IJ SOLN: 6.2500 mg | INTRAMUSCULAR | Status: DC | PRN

## 2024-03-23 MED ORDER — LACTATED RINGERS IV SOLN: INTRAVENOUS | Status: DC | PRN

## 2024-03-23 MED ORDER — DIBUCAINE (PERIANAL) 1 % EX OINT: 1.0000 | TOPICAL_OINTMENT | CUTANEOUS | Status: DC | PRN

## 2024-03-23 MED ORDER — MAGNESIUM SULFATE 40 GM/1000ML IV SOLN
INTRAVENOUS | Status: AC
  Filled 2024-03-23: qty 1000

## 2024-03-23 MED ORDER — FENTANYL CITRATE (PF) 100 MCG/2ML IJ SOLN
INTRAMUSCULAR | Status: AC
  Filled 2024-03-23: qty 2

## 2024-03-23 MED ORDER — SODIUM CHLORIDE 0.9 % IR SOLN
Status: DC | PRN
  Administered 2024-03-23 (×2): 1

## 2024-03-23 MED ORDER — POLYETHYLENE GLYCOL 3350 17 G PO PACK
17.0000 g | PACK | ORAL | Status: DC
  Administered 2024-03-24 – 2024-03-26 (×2): 17 g via ORAL
  Filled 2024-03-23 (×2): qty 1

## 2024-03-23 MED ORDER — MENTHOL 3 MG MT LOZG: 1.0000 | LOZENGE | OROMUCOSAL | Status: DC | PRN

## 2024-03-23 MED ORDER — MORPHINE SULFATE (PF) 0.5 MG/ML IJ SOLN
INTRAMUSCULAR | Status: DC | PRN
  Administered 2024-03-23: 3 mg via EPIDURAL

## 2024-03-23 MED ORDER — ONDANSETRON HCL 4 MG/2ML IJ SOLN: 4.0000 mg | Freq: Three times a day (TID) | INTRAMUSCULAR | Status: DC | PRN

## 2024-03-23 MED ORDER — SIMETHICONE 80 MG PO CHEW: 80.0000 mg | CHEWABLE_TABLET | ORAL | Status: DC | PRN

## 2024-03-23 MED ORDER — PRENATAL MULTIVITAMIN CH
1.0000 | ORAL_TABLET | Freq: Every day | ORAL | Status: DC
  Administered 2024-03-23 – 2024-03-25 (×3): 1 via ORAL
  Filled 2024-03-23 (×3): qty 1

## 2024-03-23 MED ORDER — FUROSEMIDE 20 MG PO TABS
20.0000 mg | ORAL_TABLET | Freq: Every day | ORAL | Status: DC
  Administered 2024-03-23 – 2024-03-26 (×4): 20 mg via ORAL
  Filled 2024-03-23 (×4): qty 1

## 2024-03-23 MED ORDER — DIPHENHYDRAMINE HCL 50 MG/ML IJ SOLN: 12.5000 mg | INTRAMUSCULAR | Status: DC | PRN

## 2024-03-23 MED ORDER — ACETAMINOPHEN 10 MG/ML IV SOLN
INTRAVENOUS | Status: DC | PRN
  Administered 2024-03-23: 1000 mg via INTRAVENOUS

## 2024-03-23 MED ORDER — TETANUS-DIPHTH-ACELL PERTUSSIS 5-2.5-18.5 LF-MCG/0.5 IM SUSY
0.5000 mL | PREFILLED_SYRINGE | Freq: Once | INTRAMUSCULAR | Status: DC
Start: 2024-03-24 — End: 2024-03-26

## 2024-03-23 SURGICAL SUPPLY — 28 items
BARRIER ADHS 3X4 INTERCEED (GAUZE/BANDAGES/DRESSINGS) IMPLANT
BENZOIN TINCTURE PRP APPL 2/3 (GAUZE/BANDAGES/DRESSINGS) IMPLANT
CHLORAPREP W/TINT 26 (MISCELLANEOUS) ×2 IMPLANT
CLAMP UMBILICAL CORD (MISCELLANEOUS) ×1 IMPLANT
CLOTH BEACON ORANGE TIMEOUT ST (SAFETY) ×1 IMPLANT
DRSG OPSITE POSTOP 4X10 (GAUZE/BANDAGES/DRESSINGS) ×1 IMPLANT
ELECTRODE REM PT RTRN 9FT ADLT (ELECTROSURGICAL) ×1 IMPLANT
EXTRACTOR VACUUM KIWI (MISCELLANEOUS) IMPLANT
GLOVE BIO SURGEON STRL SZ7 (GLOVE) ×1 IMPLANT
GLOVE BIOGEL PI IND STRL 7.0 (GLOVE) ×2 IMPLANT
GOWN STRL REUS W/TWL LRG LVL3 (GOWN DISPOSABLE) ×2 IMPLANT
KIT ABG SYR 3ML LUER SLIP (SYRINGE) IMPLANT
NDL HYPO 25X5/8 SAFETYGLIDE (NEEDLE) IMPLANT
NEEDLE HYPO 22GX1.5 SAFETY (NEEDLE) IMPLANT
NEEDLE HYPO 25X5/8 SAFETYGLIDE (NEEDLE) IMPLANT
NS IRRIG 1000ML POUR BTL (IV SOLUTION) ×1 IMPLANT
PACK C SECTION WH (CUSTOM PROCEDURE TRAY) ×1 IMPLANT
PAD OB MATERNITY 4.3X12.25 (PERSONAL CARE ITEMS) ×1 IMPLANT
RETRACTOR WND ALEXIS 25 LRG (MISCELLANEOUS) IMPLANT
STRIP CLOSURE SKIN 1/2X4 (GAUZE/BANDAGES/DRESSINGS) IMPLANT
SUT VIC AB 0 CT1 36 (SUTURE) ×6 IMPLANT
SUT VIC AB 0 CTX36XBRD ANBCTRL (SUTURE) ×2 IMPLANT
SUT VIC AB 2-0 CT1 TAPERPNT 27 (SUTURE) ×1 IMPLANT
SUT VIC AB 4-0 PS2 27 (SUTURE) ×1 IMPLANT
SYR CONTROL 10ML LL (SYRINGE) IMPLANT
TOWEL OR 17X24 6PK STRL BLUE (TOWEL DISPOSABLE) ×1 IMPLANT
TRAY FOLEY W/BAG SLVR 14FR LF (SET/KITS/TRAYS/PACK) IMPLANT
WATER STERILE IRR 1000ML POUR (IV SOLUTION) ×1 IMPLANT

## 2024-03-23 NOTE — Progress Notes (Signed)
 Labor Progress Note  In to re-evaluate patient. She has been sleeping comfortably x 2 hours.  SCE now 10/100/0 to +1, no cervix felt along R side or posteriorly.  Started pushing with good maternal effort FHT 120bpm, moderate variability, 1 accel, no decels; + scalp stim Toco not tracing well, RN palpating contractions. Previously contracting ~q34min.   Continue pushing with RN. Reeval in 30-60min or sooner prn  Kieth Carolin, MD Obstetrician & Gynecologist, [facility] for [employer], [facility]

## 2024-03-23 NOTE — Progress Notes (Signed)
 Labor Progress Note  In to see patient. Feeling more awake/alert.  Station now solidly +1, almost pushing to +2. Making good descent and pushing with excellent maternal effort.  FHT 120bpm, moderate variability, no accels, decels with pushing with quick return to baseline Toco q 2-61min  Pushing x 1 hour; cervix may not have been completely dilated while previously pushing, so reasonable to continue for another 1-2 hours as long as fetus continues to tolerate maternal efforts. Recheck in 1 hour or sooner prn   Kieth Carolin, MD Obstetrician & Gynecologist, [facility] for [employer], [facility]

## 2024-03-23 NOTE — Transfer of Care (Signed)
 Immediate Anesthesia Transfer of Care Note  Patient: Carla Ramsey  Procedure(s) Performed: CESAREAN DELIVERY  Patient Location: PACU  Anesthesia Type:Epidural  Level of Consciousness: awake, alert , and oriented  Airway & Oxygen Therapy: Patient Spontanous Breathing  Post-op Assessment: Report given to RN and Post -op Vital signs reviewed and stable  Post vital signs: Reviewed and stable  Last Vitals:  Vitals Value Taken Time  BP    Temp    Pulse    Resp    SpO2      Last Pain:  Vitals:   03/23/24 0730  TempSrc:   PainSc: 0-No pain         Complications: No notable events documented.

## 2024-03-23 NOTE — Lactation Note (Signed)
 This note was copied from a baby's chart. Lactation Consultation Note  Patient Name: Carla Ramsey Unijb'd Date: 03/23/2024 Age:25 hours  Mom chooses to formula feed.   Maternal Data    Feeding Nipple Type: Slow - flow  LATCH Score                    Lactation Tools Discussed/Used    Interventions    Discharge    Consult Status Consult Status: Complete    Cyruss Arata G 03/23/2024, 7:05 PM

## 2024-03-23 NOTE — Op Note (Cosign Needed Addendum)
 Carla Ramsey PROCEDURE DATE: 03/23/2024  PREOPERATIVE DIAGNOSES: Intrauterine pregnancy at [redacted]w[redacted]d weeks gestation; failure to progress: arrest of descent  POSTOPERATIVE DIAGNOSES: The same  PROCEDURE: Low Transverse Cesarean Section  SURGEON:  Dr. Lynwood Solomons  ASSISTANT:  Dr. Leeroy Pouch. An experienced assistant was required given the standard of surgical care given the complexity of the case.  This assistant was needed for exposure, dissection, suctioning, retraction, instrument exchange, assisting with delivery with administration of fundal pressure, and for overall help during the procedure.  ANESTHESIOLOGY TEAM: Anesthesiologist: Peggye Delon Brunswick, MD; Jefm Garnette LABOR, MD; Corinne Garnette BRAVO, MD CRNA: Cherilyn Slough, CRNA  INDICATIONS: Carla Ramsey is a 23 y.o. [phone] at 110w1d here for cesarean section secondary to the indications listed under preoperative diagnoses; please see preoperative note for further details.  The risks of surgery were discussed with the patient including but were not limited to: bleeding which may require transfusion or reoperation; infection which may require antibiotics; injury to bowel, bladder, ureters or other surrounding organs; injury to the fetus; need for additional procedures including hysterectomy in the event of a life-threatening hemorrhage; formation of adhesions; placental abnormalities wth subsequent pregnancies; incisional problems; thromboembolic phenomenon and other postoperative/anesthesia complications.  The patient concurred with the proposed plan, giving informed written consent for the procedure.    FINDINGS:  Viable female infant in cephalic OP presentation.  Apgars 9 and 9.  Clear amniotic fluid.  Intact placenta, three vessel cord.  Normal uterus, fallopian tubes and ovaries bilaterally.  ANESTHESIA: Epidural  INTRAVENOUS FLUIDS: 1300 ml   ESTIMATED BLOOD LOSS: 516 ml URINE OUTPUT:  300 ml SPECIMENS: Placenta sent to  L&D COMPLICATIONS: None immediate  PROCEDURE IN DETAIL:  The patient preoperatively received intravenous antibiotics and had sequential compression devices applied to her lower extremities.  She was then taken to the operating room where the epidural anesthesia was dosed up to surgical level and was found to be adequate. She was then placed in a dorsal supine position with a leftward tilt, and prepped and draped in a sterile manner.  A foley catheter was placed into her bladder and attached to constant gravity.  After an adequate timeout was performed, a Pfannenstiel skin incision was made with scalpel and carried through to the underlying layer of fascia. The fascia was incised in the midline, and this incision was extended bilaterally in a blunt fashion.  The underlying rectus muscles were dissected off the fascia superiorly and inferiorly in a blunt fashion. The rectus muscles were separated in the midline and the peritoneum was entered bluntly. The fascia was incised in the midline, and this incision was extended bilaterally bluntly.  Kocher clamps were applied to the superior aspect of the fascial incision and the underlying rectus muscles were dissected off bluntly and sharply.  A similar process was carried out on the inferior aspect of the fascial incision. The rectus muscles were separated in the midline and the peritoneum was entered bluntly. The Alexis self-retaining retractor was introduced into the abdominal cavity.  Attention was turned to the lower uterine segment where a low transverse hysterotomy was made with a scalpel and extended bilaterally bluntly.  The infant was successfully delivered, the cord was clamped and cut after one minute, and the infant was handed over to the awaiting nursery team. Uterine massage was then administered, and the placenta delivered intact with a three-vessel cord. The uterus was then cleared of clots and debris.  The hysterotomy was closed with 0 Vicryl in a  running  locked fashion, and an imbricating layer was also placed with 0 Vicryl.  The pelvis was cleared of all clot and debris. Hemostasis was confirmed on all surfaces.  The retractor was removed. The peritoneum was closed with a 2-0 Vicryl running stitch  The fascia was then closed using 0 Vicryl in a running fashion.  The subcutaneous layer was irrigated, and the skin was closed with a 4-0 Vicryl subcuticular stitch. The patient tolerated the procedure well. Sponge, instrument and needle counts were correct x 3.  She was taken to the recovery room in stable condition.    Leeroy Pouch, MD Family Medicine OB fellow, [facility] for [facility], [facility]

## 2024-03-23 NOTE — Progress Notes (Signed)
 Labor Progress Note  In to see patient.  She has been complete and pushing for ~2.5 hours now. She reports feeling exhausted.  SCE 9.5/c/0 -- can feel thin rim of cervix on right side and posteriorly. Has good amount of caput and it is difficult to tell position FHT 130bpm, moderate variability, no accels, intermittent variables Toco not tracing well  Reviewed findings with patient and her family. Suspect this may be a reflection of cervical swelling, but it is hard to know for sure as this is my first time examining the patient. Since fetus has a reassuring tracing, discussed option to wait another 1-2 hours and then reexamine. In meantime, will reposition toco, continue with position changes, and titrate pitocin  prn  Kieth Carolin, MD Obstetrician & Gynecologist, [facility] for [employer], [facility]

## 2024-03-23 NOTE — Progress Notes (Signed)
 Labor Progress Note  In to see patient. Pushing with excellent effort. SCE largely unchanged. Still +1, not quite pushing to +2, significant molding/caput so not able to adequately assess position.  FHT 120bpm, moderate, no accels, decels w/ pushing w/ quick return to baseline Ctx q2-4min  Discussed with patient my concerns for arrest of descent. She has now been pushing for 2.5 hours with minimal change in the last 1.5 hours. She is not low enough for vacuum and I reviewed that operative delivery would not be safe without being certain of infant's position. Next step would be cesarean delivery. I discussed I would feel comfortable proceeding with cesarean delivery now, and I would strongly recommend this by 4 hours of pushing (~7:30am). She would like to continue pushing and consider her options.   Kieth Carolin, MD Obstetrician & Gynecologist, [facility] for [employer], [facility]

## 2024-03-23 NOTE — Discharge Summary (Shared)
 Postpartum Discharge Summary  Date of Service updated     Patient Name: Carla Ramsey DOB: 08/14/2000 MRN: [id number]  Date of admission: 03/21/2024 Delivery date:03/23/2024 Delivering provider: EVELINE LYNWOOD MATSU Date of discharge: 03/23/2024  Admitting diagnosis: Encounter for induction of labor [Z34.90] Intrauterine pregnancy: [redacted]w[redacted]d     Secondary diagnosis:  Active Problems:   Pre-eclampsia, severe   Status post primary low transverse cesarean section  Additional problems: none    Discharge diagnosis: Term Pregnancy Delivered and Preeclampsia (severe)                                              Post partum procedures:{Postpartum procedures:23558} Augmentation: AROM, Pitocin , Cytotec , and IP Foley Complications: arrest of descent resulting in [facility] course: Induction of Labor With Cesarean Section   23 y.o. yo G2P1011 at [redacted]w[redacted]d was admitted to the hospital 03/21/2024 for induction of labor due to pre-eclampsia with severe features. Patient had a labor course significant for arrest of descent. The patient went for cesarean section due to Arrest of Descent. Delivery details are as follows: Membrane Rupture Time/Date: 2:43 PM,03/22/2024  Delivery Method:C-Section, Low Transverse Operative Delivery:N/A Details of operation can be found in separate operative Note.  Patient had a postpartum course complicated by***. She is ambulating, tolerating a regular diet, passing flatus, and urinating well.  Patient is discharged home in stable condition on 03/23/24.      Newborn Data: Birth date:03/23/2024 Birth time:8:35 AM Gender:Female Living status:Living Apgars:9 ,9  Weight:2900 g                               Magnesium  Sulfate received: Yes: Seizure prophylaxis BMZ received: No Rhophylac:N/A MMR:{MMR:30440033} T-DaP:{Tdap:23962} Transfusion:{Transfusion received:30440034}  Immunizations received:  There is no immunization history on file for this patient.  Physical exam   Vitals:   03/23/24 0531 03/23/24 0605 03/23/24 0631 03/23/24 0730  BP: (!) 134/91 96/72 (!) 119/92 123/71  Pulse: 98 (!) 110  96  Resp:   15 17  Temp:      TempSrc:      SpO2:      Weight:      Height:       General: {Exam; general:21111117} Lochia: {Desc; appropriate/inappropriate:30686::appropriate} Uterine Fundus: {Desc; firm/soft:30687} Incision: {Exam; incision:21111123} DVT Evaluation: {Exam; dvt:2111122} Labs: Lab Results  Component Value Date   WBC 12.0 (H) 03/23/2024   HGB 12.1 03/23/2024   HCT 35.9 (L) 03/23/2024   MCV 87.3 03/23/2024   PLT 172 03/23/2024      Latest Ref Rng & Units 03/23/2024    7:56 AM  CMP  Glucose 70 - 99 mg/dL 897   BUN 6 - 20 mg/dL <5   Creatinine 9.55 - 1.00 mg/dL 8.83   Sodium 864 - 854 mmol/L 130   Potassium 3.5 - 5.1 mmol/L 3.5   Chloride 98 - 111 mmol/L 101   CO2 22 - 32 mmol/L 16   Calcium 8.9 - 10.3 mg/dL 7.5   Total Protein 6.5 - 8.1 g/dL 5.8   Total Bilirubin 0.0 - 1.2 mg/dL 1.0   Alkaline Phos 38 - 126 U/L 160   AST 15 - 41 U/L 31   ALT 0 - 44 U/L 17    Edinburgh Score:     No data to display  No data recorded  After visit meds:  Allergies as of 03/23/2024       Reactions   Penicillins Hives     Med Rec must be completed prior to using this [facility]***        Discharge home in stable condition Infant Feeding: Breast Infant Disposition:home with mother Discharge instruction: per After Visit Summary and Postpartum booklet. Activity: Advance as tolerated. Pelvic rest for 6 weeks.  Diet: routine diet Future Appointments:No future appointments. Follow up Visit:   Please schedule this patient for a In person postpartum visit in 6 weeks with the following provider: MD. Additional Postpartum F/U:BP check 1 week  High risk pregnancy complicated by: pre-eclampsia with severe features Delivery mode:  C-Section, Low Transverse Anticipated Birth Control:  {Birth  Control:23956}   03/23/2024 Leeroy KATHEE Pouch, MD

## 2024-03-23 NOTE — Progress Notes (Signed)
 Decision to CS Note  Patient recommended for cesarean delivery for arrest of descent after no change in station after 1.5 hours of pushing. She requested time to consider her options and then called out stating she was ready to proceed with cesarean delivery.   The risks of surgery were discussed with the patient including but were not limited to: bleeding which may require transfusion, reoperation, or additional procedures like hysterectomy in the event of life-threatening hemorrhage; infection which may require antibiotics; injury to bowel, bladder, ureters or other surrounding organs; injury to the fetus; formation of adhesions; placental abnormalities with subsequent pregnancies; incisional problems; thromboembolic phenomenon and other postoperative/anesthesia complications.    The patient concurred with the proposed plan, giving informed written consent for the procedure. Anesthesia and OR aware. Preoperative prophylactic antibiotics and SCDs ordered on call to the OR. To OR when ready.  Kieth Carolin, MD Obstetrician & Gynecologist, [facility] for [employer], [facility]

## 2024-03-24 ENCOUNTER — Encounter: Admitting: Women's Health

## 2024-03-24 DIAGNOSIS — N179 Acute kidney failure, unspecified: Secondary | ICD-10-CM | POA: Diagnosis not present

## 2024-03-24 LAB — COMPREHENSIVE METABOLIC PANEL WITH GFR
ALT: 19 U/L (ref 0–44)
AST: 35 U/L (ref 15–41)
Albumin: 2.3 g/dL — ABNORMAL LOW (ref 3.5–5.0)
Alkaline Phosphatase: 133 U/L — ABNORMAL HIGH (ref 38–126)
Anion gap: 15 (ref 5–15)
BUN: 5 mg/dL — ABNORMAL LOW (ref 6–20)
CO2: 19 mmol/L — ABNORMAL LOW (ref 22–32)
Calcium: 7.6 mg/dL — ABNORMAL LOW (ref 8.9–10.3)
Chloride: 99 mmol/L (ref 98–111)
Creatinine, Ser: 1.06 mg/dL — ABNORMAL HIGH (ref 0.44–1.00)
GFR, Estimated: >60 mL/min (ref >60)
Glucose, Bld: 80 mg/dL (ref 70–99)
Potassium: 3.8 mmol/L (ref 3.5–5.1)
Sodium: 133 mmol/L — ABNORMAL LOW (ref 135–145)
Total Bilirubin: 0.4 mg/dL (ref 0.0–1.2)
Total Protein: 5.4 g/dL — ABNORMAL LOW (ref 6.5–8.1)

## 2024-03-24 LAB — CBC
HCT: 35.3 % — ABNORMAL LOW (ref 36.0–46.0)
Hemoglobin: 11.7 g/dL — ABNORMAL LOW (ref 12.0–15.0)
MCH: 29.3 pg (ref 26.0–34.0)
MCHC: 33.1 g/dL (ref 30.0–36.0)
MCV: 88.5 fL (ref 80.0–100.0)
Platelets: 207 K/uL (ref 150–400)
RBC: 3.99 MIL/uL (ref 3.87–5.11)
RDW: 14 % (ref 11.5–15.5)
WBC: 18.3 K/uL — ABNORMAL HIGH (ref 4.0–10.5)
nRBC: 0 % (ref 0.0–0.2)

## 2024-03-24 NOTE — Anesthesia Postprocedure Evaluation (Signed)
 Anesthesia Post Note  Patient: Carla Ramsey  Procedure(s) Performed: CESAREAN DELIVERY     Patient location during evaluation: PACU Anesthesia Type: Epidural Level of consciousness: awake, awake and alert and oriented Pain management: pain level controlled Vital Signs Assessment: post-procedure vital signs reviewed and stable Respiratory status: spontaneous breathing, nonlabored ventilation and respiratory function stable Cardiovascular status: stable Postop Assessment: no headache, no backache, epidural receding, patient able to bend at knees and no signs of nausea or vomiting Anesthetic complications: no   No notable events documented.  Last Vitals:    Last Pain:                 Garnette FORBES [place]

## 2024-03-24 NOTE — Progress Notes (Signed)
 Daily Postpartum Note  Admission Date: 03/21/2024 Current Date: 03/24/2024 7:36 AM  Carla Ramsey is a 23 y.o. G2P1011 POD#1 PLTCS (0830) for arrest of descent at 38/1 after IOL for severe pre-eclampsia (BPs)  Pregnancy complicated by: Patient Active Problem List   Diagnosis Date Noted   Status post primary low transverse cesarean section 03/23/2024   Pre-eclampsia, severe 03/21/2024   Encounter for supervision of normal pregnancy, antepartum 03/17/2024   History of miscarriage 05/27/2022   Dysmenorrhea 08/24/2019    Overnight/24hr events:  none  Subjective:  No s/s of Mg toxicity, nausea/vomiting, flatus. Foley just removed. Pain controlled and normal VB.   Objective:    Current Vital Signs 24h Vital Sign Ranges  T 97.8 F (36.6 C) Temp  Avg: 97.6 F (36.4 C)  Min: 97.5 F (36.4 C)  Max: 98.1 F (36.7 C)  BP 122/75 BP  Min: 113/95  Max: 161/88  HR 83 Pulse  Avg: 91.2  Min: 77  Max: 116  RR 18 Resp  Avg: 17.3  Min: 13  Max: 19  SaO2 96 % Room Air SpO2  Avg: 97.5 %  Min: 96 %  Max: 100 %       24 Hour I/O Current Shift I/O  Time Ins Outs 09/10 0701 - 09/11 0700 In: 7194.2 [P.O.:4350; I.V.:2326.2] Out: 5567 [Urine:4800] No intake/output data recorded.   Patient Vitals for the past 12 hrs:  BP Temp Temp src Pulse Resp SpO2  03/24/24 0524 -- -- -- -- -- 96 %  03/24/24 0521 122/75 97.8 F (36.6 C) Oral 83 18 --  03/24/24 0500 -- -- -- -- 18 --  03/24/24 0400 -- -- -- -- 18 --  03/24/24 0300 -- -- -- -- 16 --  03/24/24 0200 -- -- -- -- 18 --  03/24/24 0100 -- -- -- -- 16 --  03/24/24 0000 -- -- -- -- 18 --  03/23/24 2349 -- 98.1 F (36.7 C) Oral -- 18 98 %  03/23/24 2348 (!) 152/87 -- -- (!) 102 -- --  03/23/24 2300 -- -- -- -- 18 --  03/23/24 2200 -- -- -- -- 16 --  03/23/24 2100 -- -- -- -- 18 --  03/23/24 2000 -- -- -- -- 18 --  03/23/24 1959 (!) 145/92 -- -- 89 18 --  03/23/24 1942 (!) 161/88 97.6 F (36.4 C) Oral 95 18 96 %   Physical exam: General:  Well nourished, well developed female in no acute distress. Abdomen: moderately distended, no bowel sounds, c/d/I dressing, nttp Respiratory: no respiratory distress Skin: Warm and dry.   Medications: Current Facility-Administered Medications  Medication Dose Route Frequency Provider Last Rate Last Admin   acetaminophen  (TYLENOL ) tablet 1,000 mg  1,000 mg Oral Q6H Corinne Garnette BRAVO, MD   1,000 mg at 03/24/24 0537   coconut oil  1 Application Topical PRN Trudy Leeroy NOVAK, MD       witch hazel-glycerin  (TUCKS) pad 1 Application  1 Application Topical PRN Trudy Leeroy NOVAK, MD       And   dibucaine (NUPERCAINAL) 1 % rectal ointment 1 Application  1 Application Rectal PRN Trudy Leeroy NOVAK, MD       diphenhydrAMINE  (BENADRYL ) injection 12.5 mg  12.5 mg Intravenous Q4H PRN Corinne Garnette BRAVO, MD       Or   diphenhydrAMINE  (BENADRYL ) capsule 25 mg  25 mg Oral Q4H PRN Corinne Garnette BRAVO, MD       diphenhydrAMINE  (BENADRYL ) capsule 25 mg  25  mg Oral Q6H PRN Trudy Leeroy NOVAK, MD       enoxaparin  (LOVENOX ) injection 40 mg  40 mg Subcutaneous Q24H Trudy Leeroy NOVAK, MD   40 mg at 03/24/24 0535   furosemide  (LASIX ) tablet 20 mg  20 mg Oral Daily Izell Harari, MD   20 mg at 03/23/24 1843   gabapentin  (NEURONTIN ) capsule 100 mg  100 mg Oral TID Trudy Leeroy NOVAK, MD   100 mg at 03/23/24 2210   labetalol  (NORMODYNE ) injection 20 mg  20 mg Intravenous PRN Trudy Leeroy NOVAK, MD   20 mg at 03/21/24 2213   And   labetalol  (NORMODYNE ) injection 40 mg  40 mg Intravenous PRN Trudy Leeroy NOVAK, MD   40 mg at 03/21/24 2231   And   labetalol  (NORMODYNE ) injection 80 mg  80 mg Intravenous PRN Trudy Leeroy NOVAK, MD       And   hydrALAZINE  (APRESOLINE ) injection 10 mg  10 mg Intravenous PRN Trudy Leeroy NOVAK, MD       hydrOXYzine  (ATARAX ) tablet 25 mg  25 mg Oral TID PRN Trudy Leeroy NOVAK, MD   25 mg at 03/22/24 1705   ibuprofen  (ADVIL ) tablet 600 mg  600 mg Oral Q6H Trudy Leeroy NOVAK, MD       lactated ringers   infusion   Intravenous Continuous Zina Jerilynn LABOR, MD 10 mL/hr at 03/23/24 1201 New Bag at 03/23/24 1201   magnesium  sulfate 40 grams in SWI 1000 mL OB infusion  2 g/hr Intravenous Continuous Izell Harari, MD 50 mL/hr at 03/24/24 0525 2 g/hr at 03/24/24 0525   measles, mumps & rubella vaccine (MMR) injection 0.5 mL  0.5 mL Subcutaneous Once Trudy Leeroy NOVAK, MD       menthol -cetylpyridinium (CEPACOL) lozenge 3 mg  1 lozenge Oral Q2H PRN Trudy Leeroy NOVAK, MD       naloxone  (NARCAN ) injection 0.4 mg  0.4 mg Intravenous PRN Corinne Garnette BRAVO, MD       And   sodium chloride  flush (NS) 0.9 % injection 3 mL  3 mL Intravenous PRN Corinne Garnette BRAVO, MD       naloxone  HCl (NARCAN ) 2 mg in dextrose  5 % 250 mL infusion  1-2 mcg/kg/hr Intravenous Continuous PRN Corinne Garnette BRAVO, MD       NIFEdipine  (PROCARDIA -XL/NIFEDICAL-XL) 24 hr tablet 30 mg  30 mg Oral QHS Trudy Leeroy B, MD   30 mg at 03/23/24 2210   ondansetron  (ZOFRAN ) injection 4 mg  4 mg Intravenous Q8H PRN Turk, Stephen E, MD       polyethylene glycol (MIRALAX  / GLYCOLAX ) packet 17 g  17 g Oral QODAY Viaan Knippenberg, MD       potassium chloride  SA (KLOR-CON  M) CR tablet 20 mEq  20 mEq Oral Daily Izell Harari, MD   20 mEq at 03/23/24 1843   prenatal multivitamin tablet 1 tablet  1 tablet Oral Q1200 Trudy Leeroy NOVAK, MD   1 tablet at 03/23/24 1208   scopolamine  (TRANSDERM-SCOP) 1 MG/3DAYS 1 mg  1 patch Transdermal Once Corinne Garnette BRAVO, MD   1 mg at 03/23/24 1011   senna-docusate (Senokot-S) tablet 2 tablet  2 tablet Oral Daily Trudy Leeroy NOVAK, MD       simethicone  (MYLICON) chewable tablet 80 mg  80 mg Oral TID PC Trudy Leeroy NOVAK, MD   80 mg at 03/23/24 1402   simethicone  (MYLICON) chewable tablet 80 mg  80 mg Oral PRN Trudy Leeroy NOVAK, MD  Tdap (BOOSTRIX) injection 0.5 mL  0.5 mL Intramuscular Once Trudy Leeroy NOVAK, MD       Labs:  Recent Labs  Lab 03/22/24 1733 03/23/24 0756 03/24/24 0611  WBC 6.9 12.0* 18.3*  HGB 12.0  12.1 11.7*  HCT 36.5 35.9* 35.3*  PLT 159 172 207   Recent Labs  Lab 03/22/24 0534 03/22/24 1733 03/23/24 0756  NA 136 133* 130*  K 3.4* 3.1* 3.5  CL 107 104 101  CO2 20* 17* 16*  BUN <5* <5* <5*  CREATININE 0.78 0.62 1.16*  CALCIUM 7.8* 7.4* 7.5*  PROT 5.4* 5.8* 5.8*  BILITOT 0.6 0.8 1.0  ALKPHOS 142* 141* 160*  ALT 16 18 17   AST 25 25 31   GLUCOSE 97 85 102*   Radiology:  none  Assessment & Plan:  Patient doing well *PP: routine care. Follow up void and flatus. B POS/rpr neg/breast and bottle/consented for circ/follow up re: birth control *Severe pre-eclampsia: follow up AM labs. continue current regimen. Mg off later this morning.  *PPx: lovenox  *FEN/GI: regular diet *Dispo: likely pod#3  Bebe Izell Raddle MD Attending Center for [facility] (Faculty Practice) GYN Consult Phone: [phone] (M-F, [phone]) & [phone]  (Off hours, weekends, holidays)

## 2024-03-24 NOTE — Progress Notes (Signed)
MOB was referred for history of anxiety. ?* Referral screened out by Clinical Social Worker because none of the following criteria appear to apply: ?~ History of anxiety/depression during this pregnancy, or of post-partum depression following prior delivery. ?~ Diagnosis of anxiety and/or depression within last 3 years ?OR ?* MOB's symptoms currently being treated with medication and/or therapy. Per chart review, MOB is currently participating in therapy.  ? ?Please contact the Clinical Social Worker if needs arise, by [facility] request, or if MOB scores greater than 9/yes to question 10 on Edinburgh Postpartum Depression Screen. ? ?Abundio Miu, LCSW ?Clinical Social Worker ?[facility] ?Cell#: [phone] ? ?

## 2024-03-24 NOTE — Plan of Care (Signed)
 Problem: Education: Goal: Knowledge of disease or condition will improve Outcome: Progressing Goal: Knowledge of the prescribed therapeutic regimen will improve Outcome: Progressing   Problem: Fluid Volume: Goal: Peripheral tissue perfusion will improve Outcome: Progressing   Problem: Clinical Measurements: Goal: Complications related to disease process, condition or treatment will be avoided or minimized Outcome: Progressing   Problem: Education: Goal: Knowledge of General Education information will improve Description: Including pain rating scale, medication(s)/side effects and non-pharmacologic comfort measures Outcome: Progressing   Problem: Health Behavior/Discharge Planning: Goal: Ability to manage health-related needs will improve Outcome: Progressing   Problem: Clinical Measurements: Goal: Ability to maintain clinical measurements within normal limits will improve Outcome: Progressing Goal: Will remain free from infection Outcome: Progressing Goal: Diagnostic test results will improve Outcome: Progressing Goal: Respiratory complications will improve Outcome: Progressing Goal: Cardiovascular complication will be avoided Outcome: Progressing   Problem: Activity: Goal: Risk for activity intolerance will decrease Outcome: Progressing   Problem: Nutrition: Goal: Adequate nutrition will be maintained Outcome: Progressing   Problem: Coping: Goal: Level of anxiety will decrease Outcome: Progressing   Problem: Elimination: Goal: Will not experience complications related to bowel motility Outcome: Progressing Goal: Will not experience complications related to urinary retention Outcome: Progressing   Problem: Pain Managment: Goal: General experience of comfort will improve and/or be controlled Outcome: Progressing   Problem: Safety: Goal: Ability to remain free from injury will improve Outcome: Progressing   Problem: Skin Integrity: Goal: Risk for impaired  skin integrity will decrease Outcome: Progressing   Problem: Education: Goal: Knowledge of Childbirth will improve Outcome: Progressing Goal: Ability to make informed decisions regarding treatment and plan of care will improve Outcome: Progressing Goal: Ability to state and carry out methods to decrease the pain will improve Outcome: Progressing Goal: Individualized Educational Video(s) Outcome: Progressing   Problem: Coping: Goal: Ability to verbalize concerns and feelings about labor and delivery will improve Outcome: Progressing   Problem: Life Cycle: Goal: Ability to make normal progression through stages of labor will improve Outcome: Progressing Goal: Ability to effectively push during vaginal delivery will improve Outcome: Progressing   Problem: Role Relationship: Goal: Will demonstrate positive interactions with the child Outcome: Progressing   Problem: Safety: Goal: Risk of complications during labor and delivery will decrease Outcome: Progressing   Problem: Pain Management: Goal: Relief or control of pain from uterine contractions will improve Outcome: Progressing   Problem: Education: Goal: Knowledge of the prescribed therapeutic regimen will improve Outcome: Progressing Goal: Understanding of sexual limitations or changes related to disease process or condition will improve Outcome: Progressing Goal: Individualized Educational Video(s) Outcome: Progressing   Problem: Self-Concept: Goal: Communication of feelings regarding changes in body function or appearance will improve Outcome: Progressing   Problem: Skin Integrity: Goal: Demonstration of wound healing without infection will improve Outcome: Progressing   Problem: Education: Goal: Knowledge of condition will improve Outcome: Progressing Goal: Individualized Educational Video(s) Outcome: Progressing Goal: Individualized Newborn Educational Video(s) Outcome: Progressing   Problem:  Activity: Goal: Will verbalize the importance of balancing activity with adequate rest periods Outcome: Progressing Goal: Ability to tolerate increased activity will improve Outcome: Progressing   Problem: Coping: Goal: Ability to identify and utilize available resources and services will improve Outcome: Progressing   Problem: Life Cycle: Goal: Chance of risk for complications during the postpartum period will decrease Outcome: Progressing   Problem: Role Relationship: Goal: Ability to demonstrate positive interaction with newborn will improve Outcome: Progressing   Problem: Skin Integrity: Goal: Demonstration of wound healing without infection  will improve Outcome: Progressing

## 2024-03-25 ENCOUNTER — Encounter (HOSPITAL_COMMUNITY): Payer: Self-pay | Admitting: Obstetrics & Gynecology

## 2024-03-25 LAB — COMPREHENSIVE METABOLIC PANEL WITH GFR
ALT: 19 U/L (ref 0–44)
AST: 27 U/L (ref 15–41)
Albumin: 1.9 g/dL — ABNORMAL LOW (ref 3.5–5.0)
Alkaline Phosphatase: 112 U/L (ref 38–126)
Anion gap: 10 (ref 5–15)
BUN: 12 mg/dL (ref 6–20)
CO2: 24 mmol/L (ref 22–32)
Calcium: 7.8 mg/dL — ABNORMAL LOW (ref 8.9–10.3)
Chloride: 103 mmol/L (ref 98–111)
Creatinine, Ser: 1.03 mg/dL — ABNORMAL HIGH (ref 0.44–1.00)
GFR, Estimated: >60 mL/min (ref >60)
Glucose, Bld: 79 mg/dL (ref 70–99)
Potassium: 4.3 mmol/L (ref 3.5–5.1)
Sodium: 137 mmol/L (ref 135–145)
Total Bilirubin: 0.4 mg/dL (ref 0.0–1.2)
Total Protein: 4.9 g/dL — ABNORMAL LOW (ref 6.5–8.1)

## 2024-03-25 MED ORDER — ACETAMINOPHEN 500 MG PO TABS
1000.0000 mg | ORAL_TABLET | Freq: Four times a day (QID) | ORAL | Status: DC | PRN
  Administered 2024-03-25 (×2): 1000 mg via ORAL
  Filled 2024-03-25 (×2): qty 2

## 2024-03-25 NOTE — Progress Notes (Signed)
 Subjective: Postpartum Day 2: Cesarean Delivery Patient reports reports doing well. She has been ambulating, voiding and tolerating a regular diet. She denies HA, visual changes, RUQ/epigastric pain.    Objective: Vital signs in last 24 hours: Temp:  [97.4 F (36.3 C)-98.2 F (36.8 C)] 98.1 F (36.7 C) (09/12 0937) Pulse Rate:  [78-110] 110 (09/12 0937) Resp:  [16-18] 17 (09/12 0937) BP: (119-139)/(66-86) 131/71 (09/12 0937) SpO2:  [97 %-100 %] 99 % (09/12 0937)  Physical Exam:  General: alert, cooperative, and no distress Lochia: appropriate Uterine Fundus: firm Incision: dressing clean dry and intact DVT Evaluation: No evidence of DVT seen on physical exam.  Recent Labs    03/23/24 0756 03/24/24 0611  HGB 12.1 11.7*  HCT 35.9* 35.3*    Assessment/Plan: 23 yo POD#2 s/p C/S with severe preeclampsia s/p magnesium  sulfate Doing well postoperatively.  BP stable on current medication. Patient desires to be discharged home tomorrow Continue current postpartum care  Winton Felt, MD 03/25/2024, 9:52 AM

## 2024-03-25 NOTE — Plan of Care (Signed)
 Problem: Education: Goal: Knowledge of disease or condition will improve Outcome: Progressing Goal: Knowledge of the prescribed therapeutic regimen will improve Outcome: Progressing   Problem: Fluid Volume: Goal: Peripheral tissue perfusion will improve Outcome: Progressing   Problem: Clinical Measurements: Goal: Complications related to disease process, condition or treatment will be avoided or minimized Outcome: Progressing   Problem: Education: Goal: Knowledge of General Education information will improve Description: Including pain rating scale, medication(s)/side effects and non-pharmacologic comfort measures Outcome: Progressing   Problem: Health Behavior/Discharge Planning: Goal: Ability to manage health-related needs will improve Outcome: Progressing   Problem: Clinical Measurements: Goal: Ability to maintain clinical measurements within normal limits will improve Outcome: Progressing Goal: Will remain free from infection Outcome: Progressing Goal: Diagnostic test results will improve Outcome: Progressing Goal: Respiratory complications will improve Outcome: Progressing Goal: Cardiovascular complication will be avoided Outcome: Progressing   Problem: Activity: Goal: Risk for activity intolerance will decrease Outcome: Progressing   Problem: Nutrition: Goal: Adequate nutrition will be maintained Outcome: Progressing   Problem: Coping: Goal: Level of anxiety will decrease Outcome: Progressing   Problem: Elimination: Goal: Will not experience complications related to bowel motility Outcome: Progressing Goal: Will not experience complications related to urinary retention Outcome: Progressing   Problem: Pain Managment: Goal: General experience of comfort will improve and/or be controlled Outcome: Progressing   Problem: Safety: Goal: Ability to remain free from injury will improve Outcome: Progressing   Problem: Skin Integrity: Goal: Risk for impaired  skin integrity will decrease Outcome: Progressing   Problem: Education: Goal: Knowledge of Childbirth will improve Outcome: Progressing Goal: Ability to make informed decisions regarding treatment and plan of care will improve Outcome: Progressing Goal: Ability to state and carry out methods to decrease the pain will improve Outcome: Progressing Goal: Individualized Educational Video(s) Outcome: Progressing   Problem: Coping: Goal: Ability to verbalize concerns and feelings about labor and delivery will improve Outcome: Progressing   Problem: Life Cycle: Goal: Ability to make normal progression through stages of labor will improve Outcome: Progressing Goal: Ability to effectively push during vaginal delivery will improve Outcome: Progressing   Problem: Role Relationship: Goal: Will demonstrate positive interactions with the child Outcome: Progressing   Problem: Safety: Goal: Risk of complications during labor and delivery will decrease Outcome: Progressing   Problem: Pain Management: Goal: Relief or control of pain from uterine contractions will improve Outcome: Progressing   Problem: Education: Goal: Knowledge of the prescribed therapeutic regimen will improve Outcome: Progressing Goal: Understanding of sexual limitations or changes related to disease process or condition will improve Outcome: Progressing Goal: Individualized Educational Video(s) Outcome: Progressing   Problem: Self-Concept: Goal: Communication of feelings regarding changes in body function or appearance will improve Outcome: Progressing   Problem: Skin Integrity: Goal: Demonstration of wound healing without infection will improve Outcome: Progressing   Problem: Education: Goal: Knowledge of condition will improve Outcome: Progressing Goal: Individualized Educational Video(s) Outcome: Progressing Goal: Individualized Newborn Educational Video(s) Outcome: Progressing   Problem:  Activity: Goal: Will verbalize the importance of balancing activity with adequate rest periods Outcome: Progressing Goal: Ability to tolerate increased activity will improve Outcome: Progressing   Problem: Coping: Goal: Ability to identify and utilize available resources and services will improve Outcome: Progressing   Problem: Life Cycle: Goal: Chance of risk for complications during the postpartum period will decrease Outcome: Progressing   Problem: Role Relationship: Goal: Ability to demonstrate positive interaction with newborn will improve Outcome: Progressing   Problem: Skin Integrity: Goal: Demonstration of wound healing without infection  will improve Outcome: Progressing

## 2024-03-25 NOTE — Lactation Note (Signed)
 This note was copied from a baby's chart. Lactation Consultation Note  Patient Name: Carla Ramsey Date: 03/25/2024 Age:25 hours Reason for consult: Initial assessment;Primapara;1st time breastfeeding;Early term 37-38.6wks;Mother's request  LC in to visit with P1 Mom of ET infant delivered by C/Section. Baby is at a 6% weight loss. Mom desires to pump and bottle feed, and doesn't rule out trying to breast feed.   Yesterday, Mom states her RN set up the DEBP and she has pumped twice.  LC assisted Mom to pump using the hands free pumping top and size 18 mm flanges.  Reviewed breast massage and hand expression, easy flow of colostrum noted.  LC set up 2 basins for washing and drying of pump parts, explaining the process.  Mom has a hands free pump at home, given as a gift, unsure of brand.  Mom hasn't used her health insurance to obtain a DEBP.  STORK pump requested.  Plan recommended- 1- STS with baby as much as possible 2- Massage breasts and hand express often 3- Ask for help prn with breastfeeding 4- Pump both breast after breastfeeding or when baby is fed a bottle per volume guidelines.  Lactation Tools Discussed/Used Tools: Pump;Flanges;Hands-free pumping top;Bottle;Coconut oil Flange Size: 18 Breast pump type: Double-Electric Breast Pump Pump Education: Setup, frequency, and cleaning;Milk Storage Reason for Pumping: support milk supply/Mom desires Pumping frequency: Encouraged to pump every 2-3 hrs Pumped volume: 0 mL (drops)  Interventions Interventions: Breast feeding basics reviewed;Skin to skin;Breast massage;Hand express;Coconut oil;DEBP;Education;LC Services brochure;CDC milk storage guidelines;CDC Guidelines for Breast Pump Cleaning  Discharge Discharge Education: Engorgement and breast care Pump: Referral sent for Stork Pump;Hands Free;Personal (hands free pump given as a gift, unsure of brand) WIC Program: Yes No referral sent due to Mom desiring to pump  and bottle feed which wouldn't qualify her for [facility] pump.  Consult Status Consult Status: Follow-up Date: 03/26/24 Follow-up type: In-patient    Carla Ramsey 03/25/2024, 10:59 AM

## 2024-03-26 ENCOUNTER — Other Ambulatory Visit (HOSPITAL_COMMUNITY): Payer: Self-pay

## 2024-03-26 MED ORDER — POTASSIUM CHLORIDE CRYS ER 20 MEQ PO TBCR
20.0000 meq | EXTENDED_RELEASE_TABLET | Freq: Every day | ORAL | 0 refills | Status: DC
  Filled 2024-03-26: qty 3, 3d supply, fill #0

## 2024-03-26 MED ORDER — IBUPROFEN 600 MG PO TABS
600.0000 mg | ORAL_TABLET | Freq: Four times a day (QID) | ORAL | 0 refills | Status: DC
  Filled 2024-03-26: qty 30, 8d supply, fill #0

## 2024-03-26 MED ORDER — NIFEDIPINE ER 30 MG PO TB24
30.0000 mg | ORAL_TABLET | Freq: Every day | ORAL | 0 refills | Status: DC
  Filled 2024-03-26: qty 30, 30d supply, fill #0

## 2024-03-26 MED ORDER — FUROSEMIDE 20 MG PO TABS
20.0000 mg | ORAL_TABLET | Freq: Every day | ORAL | 0 refills | Status: DC
  Filled 2024-03-26: qty 3, 3d supply, fill #0

## 2024-03-26 NOTE — Plan of Care (Signed)
 Problem: Education: Goal: Knowledge of disease or condition will improve Outcome: Progressing Goal: Knowledge of the prescribed therapeutic regimen will improve Outcome: Progressing   Problem: Fluid Volume: Goal: Peripheral tissue perfusion will improve Outcome: Progressing   Problem: Clinical Measurements: Goal: Complications related to disease process, condition or treatment will be avoided or minimized Outcome: Progressing   Problem: Education: Goal: Knowledge of General Education information will improve Description: Including pain rating scale, medication(s)/side effects and non-pharmacologic comfort measures Outcome: Progressing   Problem: Health Behavior/Discharge Planning: Goal: Ability to manage health-related needs will improve Outcome: Progressing   Problem: Clinical Measurements: Goal: Ability to maintain clinical measurements within normal limits will improve Outcome: Progressing Goal: Will remain free from infection Outcome: Progressing Goal: Diagnostic test results will improve Outcome: Progressing Goal: Respiratory complications will improve Outcome: Progressing Goal: Cardiovascular complication will be avoided Outcome: Progressing   Problem: Activity: Goal: Risk for activity intolerance will decrease Outcome: Progressing   Problem: Nutrition: Goal: Adequate nutrition will be maintained Outcome: Progressing   Problem: Coping: Goal: Level of anxiety will decrease Outcome: Progressing   Problem: Elimination: Goal: Will not experience complications related to bowel motility Outcome: Progressing Goal: Will not experience complications related to urinary retention Outcome: Progressing   Problem: Pain Managment: Goal: General experience of comfort will improve and/or be controlled Outcome: Progressing   Problem: Safety: Goal: Ability to remain free from injury will improve Outcome: Progressing   Problem: Skin Integrity: Goal: Risk for impaired  skin integrity will decrease Outcome: Progressing   Problem: Education: Goal: Knowledge of Childbirth will improve Outcome: Progressing Goal: Ability to make informed decisions regarding treatment and plan of care will improve Outcome: Progressing Goal: Ability to state and carry out methods to decrease the pain will improve Outcome: Progressing Goal: Individualized Educational Video(s) Outcome: Progressing   Problem: Coping: Goal: Ability to verbalize concerns and feelings about labor and delivery will improve Outcome: Progressing   Problem: Life Cycle: Goal: Ability to make normal progression through stages of labor will improve Outcome: Progressing Goal: Ability to effectively push during vaginal delivery will improve Outcome: Progressing   Problem: Role Relationship: Goal: Will demonstrate positive interactions with the child Outcome: Progressing   Problem: Safety: Goal: Risk of complications during labor and delivery will decrease Outcome: Progressing   Problem: Pain Management: Goal: Relief or control of pain from uterine contractions will improve Outcome: Progressing   Problem: Education: Goal: Knowledge of the prescribed therapeutic regimen will improve Outcome: Progressing Goal: Understanding of sexual limitations or changes related to disease process or condition will improve Outcome: Progressing Goal: Individualized Educational Video(s) Outcome: Progressing   Problem: Self-Concept: Goal: Communication of feelings regarding changes in body function or appearance will improve Outcome: Progressing   Problem: Skin Integrity: Goal: Demonstration of wound healing without infection will improve Outcome: Progressing   Problem: Education: Goal: Knowledge of condition will improve Outcome: Progressing Goal: Individualized Educational Video(s) Outcome: Progressing Goal: Individualized Newborn Educational Video(s) Outcome: Progressing   Problem:  Activity: Goal: Will verbalize the importance of balancing activity with adequate rest periods Outcome: Progressing Goal: Ability to tolerate increased activity will improve Outcome: Progressing   Problem: Coping: Goal: Ability to identify and utilize available resources and services will improve Outcome: Progressing   Problem: Life Cycle: Goal: Chance of risk for complications during the postpartum period will decrease Outcome: Progressing   Problem: Role Relationship: Goal: Ability to demonstrate positive interaction with newborn will improve Outcome: Progressing   Problem: Skin Integrity: Goal: Demonstration of wound healing without infection  will improve Outcome: Progressing

## 2024-03-31 ENCOUNTER — Encounter: Admitting: Advanced Practice Midwife

## 2024-04-01 ENCOUNTER — Encounter: Payer: Self-pay | Admitting: Obstetrics & Gynecology

## 2024-04-01 ENCOUNTER — Ambulatory Visit: Admitting: Obstetrics & Gynecology

## 2024-04-01 VITALS — BP 147/96 | HR 73 | Wt 158.6 lb

## 2024-04-01 DIAGNOSIS — Z48816 Encounter for surgical aftercare following surgery on the genitourinary system: Secondary | ICD-10-CM | POA: Diagnosis not present

## 2024-04-01 DIAGNOSIS — O1493 Unspecified pre-eclampsia, third trimester: Secondary | ICD-10-CM

## 2024-04-01 DIAGNOSIS — Z98891 History of uterine scar from previous surgery: Secondary | ICD-10-CM

## 2024-04-01 NOTE — Progress Notes (Signed)
    PostOp Visit Note  Carla Ramsey is a 23 y.o. G86P1011 female who presents for a postoperative visit. She is 1 weeks postop following a primary C-section on 9/10 due to arrest of descent.  Delivery complicated by preeclampsia with severe features.  Patient is currently on Procardia  XL 30 mg daily.  She is asymptomatic.  She has checked her BP at home on 3 different occasions, 2 elevated 1 within normal range  Today she notes no acute complaints or concerns Denies fever or chills.  Tolerating gen diet.  +Flatus, Regular BMs.  Pain is well-controlled Overall doing well and reports no acute complaints   Review of Systems Pertinent items are noted in HPI.    Objective:  BP (!) 147/96 (BP Location: Left Arm, Patient Position: Sitting, Cuff Size: Normal)   Pulse 73   Wt 158 lb 9.6 oz (71.9 kg)   LMP  (LMP Unknown)   Breastfeeding Yes   BMI 28.09 kg/m    Physical Examination:  GENERAL ASSESSMENT: well developed and well nourished SKIN: normal color, no lesions CHEST: normal air exchange, respiratory effort normal with no retractions HEART: regular rate and rhythm ABDOMEN: soft, non-distended, appropriately tender, no rebound or guarding INCISION: Honeycomb removed.  Incision clean dry and intact EXTREMITY: No edema or calf tenderness PSYCH: mood appropriate, normal affect       Assessment:   Postop visit Preeclampsia with severe features with AKI  Plan:   -Incision healing appropriately, reviewed postop wound care - Patient did not take BP medication this a.m., will plan for BP check next week in the afternoon once she has taken her medicine - Questions and concerns were addressed []  Plan to recheck CMP at her 6-week postpartum visit  Delon Prude, DO Attending Obstetrician & Gynecologist, Faculty Practice Center for [employer], [facility]

## 2024-04-04 ENCOUNTER — Ambulatory Visit

## 2024-04-04 NOTE — Progress Notes (Signed)
 Subjective:  Carla Ramsey is a 23 y.o. female here for BP check and incision check.  Hypertension ROS: taking medications as instructed, no medication side effects noted, no TIA's, no chest pain on exertion, no dyspnea on exertion, and no swelling of ankles.   Pt reports incision healing well  Objective:  LMP  (LMP Unknown)   Appearance alert, well appearing, and in no distress. Incision healing well BP: 128/90 (93); 127/94 (95)   Assessment:   Blood Pressure today in office stable.  Incision healed nicely, no signs of infection, scar looks good  Plan:  Current treatment plan is effective, no change in therapy. Consulted with Dr. Rudy. Pt to follow up at [facility] on 04/05/24 in the PM for repeat check. Per patient, as discussed at last visit, if readings are elevated, they will increase her medication dose.

## 2024-04-05 ENCOUNTER — Inpatient Hospital Stay (HOSPITAL_COMMUNITY): Admit: 2024-04-05

## 2024-04-05 ENCOUNTER — Ambulatory Visit

## 2024-04-05 VITALS — BP 116/73 | HR 102

## 2024-04-05 DIAGNOSIS — Z0130 Encounter for examination of blood pressure without abnormal findings: Secondary | ICD-10-CM

## 2024-04-05 DIAGNOSIS — O1493 Unspecified pre-eclampsia, third trimester: Secondary | ICD-10-CM

## 2024-04-05 NOTE — Progress Notes (Signed)
   NURSE VISIT- BLOOD PRESSURE CHECK  SUBJECTIVE:  Carla Ramsey is a 23 y.o. G71P1011 female here for BP check. She is postpartum, delivery date 03/23/24    HYPERTENSION ROS:  Postpartum:  Severe headaches that don't go away with tylenol /other medicines: No  Visual changes (seeing spots/double/blurred vision) No  Severe pain under right breast breast or in center of upper chest No  Severe nausea/vomiting No  Taking medicines as instructed yes   OBJECTIVE:  BP 116/73 (BP Location: Right Arm, Patient Position: Sitting, Cuff Size: Normal)   Pulse (!) 102   LMP  (LMP Unknown)   Breastfeeding Yes   Appearance alert, well appearing, and in no distress.  ASSESSMENT: Postpartum  blood pressure check  PLAN: Discussed with Luke Fetters, CNM, [facility]   Recommendations: stop medicine 2 days before next visit   Follow-up: as scheduled   Aleck FORBES Blase  04/05/2024 11:29 AM

## 2024-04-07 ENCOUNTER — Encounter: Admitting: Advanced Practice Midwife

## 2024-04-07 ENCOUNTER — Other Ambulatory Visit

## 2024-04-24 DIAGNOSIS — Z419 Encounter for procedure for purposes other than remedying health state, unspecified: Secondary | ICD-10-CM | POA: Diagnosis not present

## 2024-05-03 ENCOUNTER — Encounter: Payer: Self-pay | Admitting: Obstetrics & Gynecology

## 2024-05-06 ENCOUNTER — Ambulatory Visit: Admitting: Obstetrics and Gynecology

## 2024-05-06 ENCOUNTER — Encounter: Payer: Self-pay | Admitting: Obstetrics and Gynecology

## 2024-05-06 ENCOUNTER — Other Ambulatory Visit (HOSPITAL_COMMUNITY)
Admission: RE | Admit: 2024-05-06 | Discharge: 2024-05-06 | Disposition: A | Source: Ambulatory Visit | Attending: Obstetrics & Gynecology | Admitting: Obstetrics & Gynecology

## 2024-05-06 DIAGNOSIS — Z124 Encounter for screening for malignant neoplasm of cervix: Secondary | ICD-10-CM | POA: Diagnosis present

## 2024-05-06 DIAGNOSIS — Z1151 Encounter for screening for human papillomavirus (HPV): Secondary | ICD-10-CM | POA: Diagnosis not present

## 2024-05-06 DIAGNOSIS — Z8759 Personal history of other complications of pregnancy, childbirth and the puerperium: Secondary | ICD-10-CM

## 2024-05-06 NOTE — Patient Instructions (Signed)
 To help dry up your breast milk you can try the following:  Sudafed 60mg  daily for 5 days-- you can buy this at pharmacies. It is typically used as a decongestant dries mucous membranes and can help decrease milk supply  [employer] supplement- this is available at health stores or places like [employer]. https://kellymom.com/bf/can-i-breastfeed/herbs/herbs-oversupply/  Sage tea contains a natural form of estrogen and can decrease your supply and help dry up your milk. You can buy it at the health food store, or use the spice from your kitchen. Take 1 tsp of rubbed sage with 1 cup of hot water and let it steep for about 15 minutes. You will want to add some milk or honey to it as it is very bitter. One full cup every 6 hours usually will usually dry the milk up quickly. Doree Fudge works best if you use it along with cabbage leaf compresses. You can also get an alcohol tincture from a health food store. 3-4 ml every 6 hours usually dries up the milk quickly and goes down a little faster than the tea. The tincture is more readily absorbed in the mucous membranes, so it is somewhat more efficient at decreasing your milk supply.  Cabbage application to the breasts Cabbage leaf compresses are a home remedy that has been used for over a hundred years to reduce engorgement and dry up milk.  Here's how to use them: Buy plain green cabbage. Rinse and dry leaves. Put them in the refrigerator. Remove base of hard core vein and gently pound leaves. Wrap around breast and areola, leaving nipple exposed. The leaves fit nicely around the breast, and the cold feels good. Cover entire breast, and if needed, the area under your arms. Change every 30 minutes or sooner if the leaves become wilted.  Wear a supportive but not tight bra  Remove only enough milk for comfort with a hand pump or hand expression  Take 200 mg of vitamin B6 each day for 5 days to relieve engorgement.

## 2024-05-06 NOTE — Progress Notes (Signed)
 Post Partum Visit Note  Carla Ramsey is a 23 y.o. G77P1011 female who presents for a postpartum visit. She is 6 weeks postpartum following a cesarean section.  I have fully reviewed the prenatal and intrapartum course. The delivery was at 38.1 gestational weeks.  Anesthesia: epidural. Postpartum course has been good. Baby is doing well. Baby is feeding by formula. Bleeding no bleeding. Bowel function is normal. Bladder function is normal. Patient is sexually active. Contraception method is Depo-Provera  injections. Postpartum depression screening: negative.   Upstream - 05/06/24 1024       Pregnancy Intention Screening   Does the patient want to become pregnant in the next year? No    Does the patient's partner want to become pregnant in the next year? No    Would the patient like to discuss contraceptive options today? Yes      Contraception Wrap Up   Current Method Hormonal Injection    End Method Hormonal Injection    Contraception Counseling Provided Yes         The pregnancy intention screening data noted above was reviewed. Potential methods of contraception were discussed. The patient elected to proceed with Hormonal Injection.   Edinburgh Postnatal Depression Scale - 05/06/24 1023       Edinburgh Postnatal Depression Scale:  In the Past 7 Days   I have been able to laugh and see the funny side of things. 0    I have looked forward with enjoyment to things. 0    I have blamed myself unnecessarily when things went wrong. 0    I have been anxious or worried for no good reason. 0    I have felt scared or panicky for no good reason. 0    Things have been getting on top of me. 1    I have been so unhappy that I have had difficulty sleeping. 0    I have felt sad or miserable. 0    I have been so unhappy that I have been crying. 0    The thought of harming myself has occurred to me. 0    Edinburgh Postnatal Depression Scale Total 1          Health Maintenance Due   Topic Date Due   HPV VACCINES (1 - 3-dose series) Never done   Hepatitis C Screening  Never done   DTaP/Tdap/Td (1 - Tdap) Never done   Hepatitis B Vaccines 19-59 Average Risk (1 of 3 - 19+ 3-dose series) Never done   Cervical Cancer Screening (Pap smear)  04/18/2023   Influenza Vaccine  Never done   COVID-19 Vaccine (1 - 2025-26 season) Never done    The following portions of the patient's history were reviewed and updated as appropriate: allergies, current medications, past family history, past medical history, past social history, past surgical history, and problem list.  Review of Systems Pertinent items are noted in HPI.  Objective:  BP 130/86 (BP Location: Right Arm, Patient Position: Sitting, Cuff Size: Normal)   Pulse 72   Ht 5' 2 (1.575 m)   Wt 151 lb (68.5 kg)   LMP 04/28/2024   Breastfeeding No   BMI 27.62 kg/m    General:  alert and cooperative   Breasts:  not indicated  Lungs: Normal effort  Heart:  Normal rate  Abdomen: Soft, nondistended   Wound well approximated incision, no drainage, no erythema  GU exam:  Pelvic: normal appearing vulva with no masses, tenderness or lesions  VAGINA: normal appearing vagina with normal color and discharge, no lesions  CERVIX: normal appearing cervix without discharge or lesions, no CMT  Thin prep pap is done   Extremities:  No swelling or varicosities noted        Assessment:   1. Postpartum exam (Primary)   2. Pap smear for cervical cancer screening  - Cytology - PAP( [place])  3. History of severe pre-eclampsia Has not taken meds in 2+ days, normotensive today, instructed to monitor BP at home and notify if >140s/90s    Plan:   Essential components of care per ACOG recommendations:  1.  Mood and well being: Patient with negative depression screening today. Reviewed local resources for support.  - Patient tobacco use? No.   - hx of drug use? No.    2. Infant care and feeding:  -Patient currently  breastmilk feeding? Formula   -Social determinants of health (SDOH) reviewed in EPIC. No concerns  3. Sexuality, contraception and birth spacing - Patient does not want a pregnancy in the next year.  - Reviewed reproductive life planning. Reviewed contraceptive methods based on pt preferences and effectiveness.  Patient desired Hormonal Injection .   - Discussed birth spacing of 18 months  4. Sleep and fatigue -Encouraged family/partner/community support of 4 hrs of uninterrupted sleep to help with mood and fatigue  5. Physical Recovery  - Discussed patients delivery and complications.  - Patient had a C-section for arrest of descent, failure to progress. Patient had a none laceration. Perineal healing reviewed. Patient expressed understanding - Patient has urinary incontinence? No. - Patient is safe to resume physical and sexual activity  6.  Health Maintenance - HM due items addressed Yes - Last pap smear  Diagnosis  Date Value Ref Range Status  04/17/2020   Final   - Negative for Intraepithelial Lesions or Malignancy (NILM)  04/17/2020 - Benign reactive/reparative changes  Final   Pap smear not done at today's visit.  -Breast Cancer screening indicated? No.   7. Chronic Disease/Pregnancy Condition follow up: None  - PCP follow up  Nidia Daring, FNP [facility] for [employer], [facility]

## 2024-05-10 LAB — CYTOLOGY - PAP
Comment: NEGATIVE
Comment: NEGATIVE
Comment: NEGATIVE
HPV 16: NEGATIVE
HPV 18 / 45: NEGATIVE
High risk HPV: POSITIVE — AB

## 2024-05-13 ENCOUNTER — Ambulatory Visit: Payer: Self-pay | Admitting: Obstetrics and Gynecology

## 2024-05-13 DIAGNOSIS — R87622 Low grade squamous intraepithelial lesion on cytologic smear of vagina (LGSIL): Secondary | ICD-10-CM | POA: Insufficient documentation

## 2024-05-20 ENCOUNTER — Other Ambulatory Visit: Payer: Self-pay | Admitting: Adult Health

## 2024-05-20 ENCOUNTER — Ambulatory Visit

## 2024-05-20 DIAGNOSIS — Z3042 Encounter for surveillance of injectable contraceptive: Secondary | ICD-10-CM

## 2024-05-20 DIAGNOSIS — Z3202 Encounter for pregnancy test, result negative: Secondary | ICD-10-CM

## 2024-05-20 LAB — POCT URINE PREGNANCY: Preg Test, Ur: NEGATIVE

## 2024-05-20 MED ORDER — MEDROXYPROGESTERONE ACETATE 150 MG/ML IM SUSP: 150.0000 mg | INTRAMUSCULAR | 4 refills | Status: AC

## 2024-05-20 NOTE — Progress Notes (Signed)
   NURSE VISIT- INJECTION  SUBJECTIVE:  Carla Ramsey is a 23 y.o. G68P1011 female here for a Depo Provera  for contraception/period management. She is a GYN patient. Patient was seen on 05/06/24 for Postpartum appointment. Patient has intercourse less than 2 weeks before appointment and was un eligible for contraception at that visit. Provider did not send in prescription for contraception at 10/24 appointment. Patient opted for a provider at today's visit to send it in and her pick it up to bring into the office for administration. Rn advised patient of office hours and the latest she will be able to come back would be 11:30. Patient verbalized understanding that is she was not back she would need to come next week for administration.  OBJECTIVE:  LMP 04/28/2024   Appears well, in no apparent distress  No orders of the defined types were placed in this encounter. Urine pregnancy test performed an negative.   ASSESSMENT: Patient to pick up prescription from pharmacy and return for administration.  PLAN: Rn to contact patient about rescheduling administration as well as colposcopy with a provider  Carla Ramsey  05/20/2024 11:40 AM

## 2024-05-20 NOTE — Progress Notes (Signed)
Rx depo 

## 2024-05-24 ENCOUNTER — Ambulatory Visit (INDEPENDENT_AMBULATORY_CARE_PROVIDER_SITE_OTHER): Admitting: *Deleted

## 2024-05-24 DIAGNOSIS — Z3042 Encounter for surveillance of injectable contraceptive: Secondary | ICD-10-CM | POA: Diagnosis not present

## 2024-05-24 MED ORDER — MEDROXYPROGESTERONE ACETATE 150 MG/ML IM SUSY
150.0000 mg | PREFILLED_SYRINGE | Freq: Once | INTRAMUSCULAR | Status: AC
Start: 2024-05-24 — End: 2024-05-24
  Administered 2024-05-24: 150 mg via INTRAMUSCULAR

## 2024-05-24 NOTE — Progress Notes (Signed)
   NURSE VISIT- INJECTION  SUBJECTIVE:  Carla Ramsey is a 23 y.o. G55P1011 female here for a Depo Provera  for contraception/period management. She is a GYN patient.   OBJECTIVE:  LMP 04/28/2024   Appears well, in no apparent distress  Injection administered in: Right deltoid  Meds ordered this encounter  Medications   medroxyPROGESTERone  Acetate SUSY 150 mg    ASSESSMENT: GYN patient Depo Provera  for contraception/period management PLAN: Follow-up: in 11-13 weeks for next Depo   Alan LITTIE Fischer  05/24/2024 9:20 AM

## 2024-06-01 ENCOUNTER — Encounter: Admitting: Obstetrics & Gynecology

## 2024-06-20 ENCOUNTER — Encounter: Payer: Self-pay | Admitting: Women's Health

## 2024-06-22 ENCOUNTER — Other Ambulatory Visit (HOSPITAL_COMMUNITY)
Admission: RE | Admit: 2024-06-22 | Discharge: 2024-06-22 | Disposition: A | Source: Ambulatory Visit | Attending: Obstetrics & Gynecology | Admitting: Obstetrics & Gynecology

## 2024-06-22 ENCOUNTER — Encounter: Payer: Self-pay | Admitting: Obstetrics & Gynecology

## 2024-06-22 ENCOUNTER — Ambulatory Visit: Admitting: Obstetrics & Gynecology

## 2024-06-22 VITALS — BP 138/88

## 2024-06-22 DIAGNOSIS — R87612 Low grade squamous intraepithelial lesion on cytologic smear of cervix (LGSIL): Secondary | ICD-10-CM

## 2024-06-22 DIAGNOSIS — N870 Mild cervical dysplasia: Secondary | ICD-10-CM | POA: Diagnosis not present

## 2024-06-22 NOTE — Progress Notes (Signed)
° ° °  Patient name: Carla Ramsey MRN [id number]  Date of birth: 05/21/2001 Chief Complaint:   Colposcopy  History of Present Illness:   Carla Ramsey is a 23 y.o. G85P1011 female being seen today for cervical dysplasia management.  Smoker:  No. New sexual partner:  Yes.     Currently on Depo for contraception.  She has noted some irregular spotting, but denies postcoital bleeding.  Denies irregular discharge, itching or irritation.  Denies pelvic or abdominal pain.  Reports no acute GYN concerns  Prior cytology: LSIL, HPV+  No LMP recorded. Patient has had an injection.     03/17/2024   11:50 AM 05/27/2022   11:00 AM 04/17/2020    8:46 AM  Depression screen PHQ 2/9  Decreased Interest 1 0 0  Down, Depressed, Hopeless 1 1 0  PHQ - 2 Score 2 1 0  Altered sleeping 1  0  Tired, decreased energy 1  1  Change in appetite 0  0  Feeling bad or failure about yourself  0  0  Trouble concentrating 0  0  Moving slowly or fidgety/restless 0  0  Suicidal thoughts 0  0  PHQ-9 Score 4   1   Difficult doing work/chores   Not difficult at all     Data saved with a previous flowsheet row definition     Review of Systems:   Pertinent items are noted in HPI Denies fever/chills, dizziness, headaches, visual disturbances, fatigue, shortness of breath, chest pain, abdominal pain, vomiting, no problems with periods, bowel movements, urination, or intercourse unless otherwise stated above.  Pertinent History Reviewed:  Reviewed past medical,surgical, social, obstetrical and family history.  Reviewed problem list, medications and allergies. Physical Assessment:   Vitals:   06/22/24 1155  BP: 138/88  There is no height or weight on file to calculate BMI.       Physical Examination:   General appearance: alert, well appearing, and in no distress  Psych: mood appropriate, normal affect  Skin: warm & dry   Cardiovascular: normal heart rate noted  Respiratory: normal respiratory effort, no  distress  Abdomen: soft, non-tender   Pelvic: normal external genitalia, vulva, vagina, cervix, uterus and adnexa  Extremities: no edema   Chaperone: Aleck Blase    Colposcopy Procedure Note  Indications: LSIL, HPV+  Procedure Details  The risks and benefits of the procedure and Written informed consent obtained.  Speculum placed in vagina and excellent visualization of cervix achieved, cervix swabbed x 3 with acetic acid solution.  Findings: Adequate colposcopy is noted today.  TMZ zone visualized  Cervix: acetowhite lesion(s) noted at 12 o'clock; ECC and cervical biopsies obtained.    Monsel's applied.  Adequate hemostasis noted  Specimens: ECC and cervical biopsy  Complications: none.  Colposcopic Impression:   Plan(Based on 2019 ASCCP recommendations)  -Discussed HPV- reviewed incidence and its potential to cause condylomas to dysplasia to cervical cancer -Reviewed degree of abnormal pap smears  -Discussed ASCCP guidelines and current recommendations for colposcopy -As above, inform consent obtained and procedure completed -biopsies obtained, further management pending results -Questions and concerns were addressed  Kriston Mckinnie, DO Attending Obstetrician & Gynecologist, Faculty Practice Center for [facility], [facility]

## 2024-06-24 DIAGNOSIS — Z419 Encounter for procedure for purposes other than remedying health state, unspecified: Secondary | ICD-10-CM | POA: Diagnosis not present

## 2024-06-27 ENCOUNTER — Other Ambulatory Visit: Payer: Self-pay | Admitting: Women's Health

## 2024-06-27 MED ORDER — METRONIDAZOLE 500 MG PO TABS: 500.0000 mg | ORAL_TABLET | Freq: Two times a day (BID) | ORAL | 0 refills | Status: AC

## 2024-06-28 LAB — SURGICAL PATHOLOGY

## 2024-06-29 ENCOUNTER — Ambulatory Visit: Payer: Self-pay | Admitting: Obstetrics & Gynecology

## 2024-07-21 ENCOUNTER — Ambulatory Visit: Admitting: Adult Health

## 2024-07-26 ENCOUNTER — Encounter: Payer: Self-pay | Admitting: Adult Health

## 2024-07-26 ENCOUNTER — Ambulatory Visit: Admitting: Adult Health

## 2024-07-26 VITALS — BP 119/83 | HR 104 | Ht 62.0 in | Wt 149.0 lb

## 2024-07-26 DIAGNOSIS — B081 Molluscum contagiosum: Secondary | ICD-10-CM | POA: Diagnosis not present

## 2024-07-26 NOTE — Progress Notes (Signed)
" °  Subjective:     Patient ID: Carla Ramsey, female   DOB: [birth year], 24 y.o.   MRN: [id number]  HPI Carla Ramsey is a 24 year old black female, single, G2P1011 in to have bumps in vaginal area looked at, first noticed in September 2025 a end of her pregnancy and discuss HPV vaccine.     Component Value Date/Time   DIAGPAP - Low grade squamous intraepithelial lesion (LSIL) (A) 05/06/2024 1107   DIAGPAP  04/17/2020 0849    - Negative for Intraepithelial Lesions or Malignancy (NILM)   DIAGPAP - Benign reactive/reparative changes 04/17/2020 0849   HPVHIGH Positive (A) 05/06/2024 1107   ADEQPAP  05/06/2024 1107    Satisfactory for evaluation; transformation zone component PRESENT.   ADEQPAP  04/17/2020 0849    Satisfactory for evaluation; transformation zone component PRESENT.   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 Reviewed medications and allergies.     Objective:   Physical Exam BP 119/83 (BP Location: Right Arm, Patient Position: Sitting, Cuff Size: Normal)   Pulse (!) 104   Ht 5' 2 (1.575 m)   Wt 149 lb (67.6 kg)   Breastfeeding No   BMI 27.25 kg/m     Skin warm and dry.Pelvic: external genitalia is normal in appearance, she has numerous skin colored papules, inner thigh, near peri area, and skin tag on the right inner thigh. Dr Ozan for co exam and agrees looks like molluscum contagiosum.  Fall risk is low  Upstream - 07/26/24 1602       Pregnancy Intention Screening   Does the patient want to become pregnant in the next year? No    Does the patient's partner want to become pregnant in the next year? No    Would the patient like to discuss contraceptive options today? No      Contraception Wrap Up   Current Method Hormonal Injection    End Method Hormonal Injection    Contraception Counseling Provided Yes         Examination chaperoned by Clarita Salt  LPN  Assessment:     1. Molluscum contagiosum (Primary) + numerous skin colored papules, inner thigh, near peri area Can leave alone and will resolve on their own, do not shave over them     Plan:    Call PCP to see if had HPV vaccine as teen, if did not and wants to give  Follow up prn     "

## 2024-08-15 ENCOUNTER — Encounter: Payer: Self-pay | Admitting: *Deleted

## 2024-08-16 ENCOUNTER — Ambulatory Visit

## 2024-08-18 ENCOUNTER — Ambulatory Visit

## 2024-08-18 DIAGNOSIS — Z3042 Encounter for surveillance of injectable contraceptive: Secondary | ICD-10-CM | POA: Diagnosis not present

## 2024-08-18 MED ORDER — MEDROXYPROGESTERONE ACETATE 150 MG/ML IM SUSY
150.0000 mg | PREFILLED_SYRINGE | Freq: Once | INTRAMUSCULAR | Status: AC
  Administered 2024-08-18: 150 mg via INTRAMUSCULAR

## 2024-08-18 NOTE — Progress Notes (Signed)
" ° °  NURSE VISIT- INJECTION  SUBJECTIVE:  Carla Ramsey is a 24 y.o. G52P1011 female here for a Depo Provera  for contraception/period management. She is a GYN patient.   OBJECTIVE:  There were no vitals taken for this visit.  Appears well, in no apparent distress  Injection administered in: Left deltoid  Meds ordered this encounter  Medications   medroxyPROGESTERone  Acetate SUSY 150 mg    ASSESSMENT: GYN patient Depo Provera  for contraception/period management PLAN: Follow-up: in 11-13 weeks for next Depo   Aleck FORBES Blase  08/18/2024 10:59 AM  "

## 2024-11-10 ENCOUNTER — Ambulatory Visit
